# Patient Record
Sex: Female | Born: 1965 | Race: White | Hispanic: No | Marital: Married | Smoking: Never smoker
Health system: Southern US, Community
[De-identification: ages and names within clinical notes are randomized; demographics above are authoritative.]

## PROBLEM LIST (undated history)

## (undated) DIAGNOSIS — D034 Melanoma in situ of scalp and neck: Secondary | ICD-10-CM

## (undated) DIAGNOSIS — G709 Myoneural disorder, unspecified: Secondary | ICD-10-CM

## (undated) DIAGNOSIS — R1900 Intra-abdominal and pelvic swelling, mass and lump, unspecified site: Secondary | ICD-10-CM

## (undated) DIAGNOSIS — R519 Headache, unspecified: Secondary | ICD-10-CM

## (undated) DIAGNOSIS — J329 Chronic sinusitis, unspecified: Secondary | ICD-10-CM

## (undated) DIAGNOSIS — M7918 Myalgia, other site: Secondary | ICD-10-CM

## (undated) DIAGNOSIS — N644 Mastodynia: Secondary | ICD-10-CM

## (undated) DIAGNOSIS — R635 Abnormal weight gain: Secondary | ICD-10-CM

## (undated) DIAGNOSIS — K760 Fatty (change of) liver, not elsewhere classified: Secondary | ICD-10-CM

## (undated) DIAGNOSIS — R5383 Other fatigue: Secondary | ICD-10-CM

## (undated) DIAGNOSIS — R002 Palpitations: Secondary | ICD-10-CM

## (undated) DIAGNOSIS — R531 Weakness: Secondary | ICD-10-CM

## (undated) DIAGNOSIS — H539 Unspecified visual disturbance: Secondary | ICD-10-CM

## (undated) DIAGNOSIS — L989 Disorder of the skin and subcutaneous tissue, unspecified: Secondary | ICD-10-CM

## (undated) DIAGNOSIS — L988 Other specified disorders of the skin and subcutaneous tissue: Secondary | ICD-10-CM

## (undated) DIAGNOSIS — K5900 Constipation, unspecified: Secondary | ICD-10-CM

## (undated) DIAGNOSIS — E785 Hyperlipidemia, unspecified: Secondary | ICD-10-CM

## (undated) DIAGNOSIS — N898 Other specified noninflammatory disorders of vagina: Secondary | ICD-10-CM

## (undated) DIAGNOSIS — R51 Headache: Secondary | ICD-10-CM

## (undated) DIAGNOSIS — Z8619 Personal history of other infectious and parasitic diseases: Secondary | ICD-10-CM

## (undated) DIAGNOSIS — R42 Dizziness and giddiness: Secondary | ICD-10-CM

## (undated) DIAGNOSIS — R102 Pelvic and perineal pain: Secondary | ICD-10-CM

## (undated) DIAGNOSIS — N649 Disorder of breast, unspecified: Secondary | ICD-10-CM

## (undated) DIAGNOSIS — E079 Disorder of thyroid, unspecified: Secondary | ICD-10-CM

## (undated) DIAGNOSIS — C801 Malignant (primary) neoplasm, unspecified: Secondary | ICD-10-CM

## (undated) HISTORY — DX: Other specified disorders of the skin and subcutaneous tissue: L98.8

## (undated) HISTORY — PX: HERNIA REPAIR: SHX51

## (undated) HISTORY — PX: ABDOMINAL HYSTERECTOMY: SHX81

## (undated) HISTORY — DX: Myoneural disorder, unspecified: G70.9

## (undated) HISTORY — DX: Weakness: R53.1

## (undated) HISTORY — DX: Fatty (change of) liver, not elsewhere classified: K76.0

## (undated) HISTORY — DX: Hyperlipidemia, unspecified: E78.5

## (undated) HISTORY — DX: Headache: R51

## (undated) HISTORY — DX: Other specified noninflammatory disorders of vagina: N89.8

## (undated) HISTORY — DX: Malignant (primary) neoplasm, unspecified: C80.1

## (undated) HISTORY — DX: Dizziness and giddiness: R42

## (undated) HISTORY — DX: Disorder of breast, unspecified: N64.9

## (undated) HISTORY — DX: Unspecified visual disturbance: H53.9

## (undated) HISTORY — DX: Intra-abdominal and pelvic swelling, mass and lump, unspecified site: R19.00

## (undated) HISTORY — DX: Mastodynia: N64.4

## (undated) HISTORY — DX: Constipation, unspecified: K59.00

## (undated) HISTORY — DX: Pelvic and perineal pain: R10.2

## (undated) HISTORY — DX: Other fatigue: R53.83

## (undated) HISTORY — DX: Chronic sinusitis, unspecified: J32.9

## (undated) HISTORY — DX: Palpitations: R00.2

## (undated) HISTORY — DX: Headache, unspecified: R51.9

## (undated) HISTORY — DX: Abnormal weight gain: R63.5

## (undated) HISTORY — DX: Disorder of the skin and subcutaneous tissue, unspecified: L98.9

## (undated) HISTORY — DX: Personal history of other infectious and parasitic diseases: Z86.19

## (undated) HISTORY — DX: Myalgia, other site: M79.18

---

## 1998-01-21 ENCOUNTER — Other Ambulatory Visit: Admission: RE | Admit: 1998-01-21 | Discharge: 1998-01-21 | Payer: Self-pay | Admitting: Obstetrics and Gynecology

## 1998-02-05 ENCOUNTER — Ambulatory Visit (HOSPITAL_COMMUNITY): Admission: RE | Admit: 1998-02-05 | Discharge: 1998-02-05 | Payer: Self-pay | Admitting: Obstetrics and Gynecology

## 1998-09-29 ENCOUNTER — Other Ambulatory Visit: Admission: RE | Admit: 1998-09-29 | Discharge: 1998-09-29 | Payer: Self-pay | Admitting: Obstetrics and Gynecology

## 1998-10-05 ENCOUNTER — Inpatient Hospital Stay (HOSPITAL_COMMUNITY): Admission: AD | Admit: 1998-10-05 | Discharge: 1998-10-05 | Payer: Self-pay | Admitting: Obstetrics and Gynecology

## 1998-10-05 ENCOUNTER — Encounter: Payer: Self-pay | Admitting: Obstetrics and Gynecology

## 1999-01-29 ENCOUNTER — Other Ambulatory Visit: Admission: RE | Admit: 1999-01-29 | Discharge: 1999-01-29 | Payer: Self-pay | Admitting: Obstetrics and Gynecology

## 1999-09-17 ENCOUNTER — Other Ambulatory Visit: Admission: RE | Admit: 1999-09-17 | Discharge: 1999-09-17 | Payer: Self-pay | Admitting: Obstetrics and Gynecology

## 1999-11-06 ENCOUNTER — Encounter: Admission: RE | Admit: 1999-11-06 | Discharge: 1999-11-06 | Payer: Self-pay | Admitting: Obstetrics and Gynecology

## 1999-11-06 ENCOUNTER — Encounter: Payer: Self-pay | Admitting: Obstetrics and Gynecology

## 2000-07-07 ENCOUNTER — Inpatient Hospital Stay (HOSPITAL_COMMUNITY): Admission: AD | Admit: 2000-07-07 | Discharge: 2000-07-10 | Payer: Self-pay | Admitting: Obstetrics and Gynecology

## 2000-09-20 ENCOUNTER — Other Ambulatory Visit: Admission: RE | Admit: 2000-09-20 | Discharge: 2000-09-20 | Payer: Self-pay | Admitting: Obstetrics and Gynecology

## 2001-04-21 ENCOUNTER — Encounter: Admission: RE | Admit: 2001-04-21 | Discharge: 2001-04-21 | Payer: Self-pay | Admitting: Obstetrics and Gynecology

## 2001-04-21 ENCOUNTER — Encounter: Payer: Self-pay | Admitting: Obstetrics and Gynecology

## 2001-06-01 ENCOUNTER — Encounter: Admission: RE | Admit: 2001-06-01 | Discharge: 2001-06-01 | Payer: Self-pay | Admitting: Obstetrics and Gynecology

## 2001-06-01 ENCOUNTER — Encounter: Payer: Self-pay | Admitting: Obstetrics and Gynecology

## 2001-10-09 ENCOUNTER — Other Ambulatory Visit: Admission: RE | Admit: 2001-10-09 | Discharge: 2001-10-09 | Payer: Self-pay | Admitting: Obstetrics and Gynecology

## 2002-04-23 ENCOUNTER — Ambulatory Visit (HOSPITAL_COMMUNITY): Admission: RE | Admit: 2002-04-23 | Discharge: 2002-04-23 | Payer: Self-pay | Admitting: Gastroenterology

## 2002-11-01 ENCOUNTER — Encounter: Payer: Self-pay | Admitting: Obstetrics and Gynecology

## 2002-11-01 ENCOUNTER — Encounter: Admission: RE | Admit: 2002-11-01 | Discharge: 2002-11-01 | Payer: Self-pay | Admitting: Obstetrics and Gynecology

## 2002-12-05 ENCOUNTER — Other Ambulatory Visit: Admission: RE | Admit: 2002-12-05 | Discharge: 2002-12-05 | Payer: Self-pay | Admitting: Obstetrics and Gynecology

## 2002-12-07 ENCOUNTER — Other Ambulatory Visit: Admission: RE | Admit: 2002-12-07 | Discharge: 2002-12-07 | Payer: Self-pay | Admitting: Obstetrics and Gynecology

## 2003-03-13 ENCOUNTER — Encounter: Admission: RE | Admit: 2003-03-13 | Discharge: 2003-03-13 | Payer: Self-pay | Admitting: Obstetrics and Gynecology

## 2003-05-23 ENCOUNTER — Inpatient Hospital Stay (HOSPITAL_COMMUNITY): Admission: AD | Admit: 2003-05-23 | Discharge: 2003-05-24 | Payer: Self-pay | Admitting: Obstetrics and Gynecology

## 2003-06-27 ENCOUNTER — Inpatient Hospital Stay (HOSPITAL_COMMUNITY): Admission: RE | Admit: 2003-06-27 | Discharge: 2003-06-30 | Payer: Self-pay | Admitting: Obstetrics and Gynecology

## 2003-08-22 ENCOUNTER — Other Ambulatory Visit: Admission: RE | Admit: 2003-08-22 | Discharge: 2003-08-22 | Payer: Self-pay | Admitting: Obstetrics and Gynecology

## 2003-08-30 ENCOUNTER — Encounter: Admission: RE | Admit: 2003-08-30 | Discharge: 2003-08-30 | Payer: Self-pay | Admitting: Family Medicine

## 2003-09-05 ENCOUNTER — Encounter: Admission: RE | Admit: 2003-09-05 | Discharge: 2003-09-05 | Payer: Self-pay | Admitting: Obstetrics and Gynecology

## 2004-04-20 ENCOUNTER — Encounter: Admission: RE | Admit: 2004-04-20 | Discharge: 2004-04-20 | Payer: Self-pay | Admitting: Obstetrics and Gynecology

## 2004-11-13 ENCOUNTER — Encounter: Admission: RE | Admit: 2004-11-13 | Discharge: 2004-11-13 | Payer: Self-pay | Admitting: Obstetrics and Gynecology

## 2004-11-25 ENCOUNTER — Other Ambulatory Visit: Admission: RE | Admit: 2004-11-25 | Discharge: 2004-11-25 | Payer: Self-pay | Admitting: Obstetrics and Gynecology

## 2005-10-15 ENCOUNTER — Encounter (INDEPENDENT_AMBULATORY_CARE_PROVIDER_SITE_OTHER): Payer: Self-pay | Admitting: Specialist

## 2005-10-15 ENCOUNTER — Ambulatory Visit (HOSPITAL_COMMUNITY): Admission: RE | Admit: 2005-10-15 | Discharge: 2005-10-15 | Payer: Self-pay | Admitting: Obstetrics and Gynecology

## 2006-01-13 ENCOUNTER — Encounter: Admission: RE | Admit: 2006-01-13 | Discharge: 2006-01-13 | Payer: Self-pay | Admitting: Obstetrics and Gynecology

## 2006-11-03 ENCOUNTER — Encounter: Admission: RE | Admit: 2006-11-03 | Discharge: 2006-11-03 | Payer: Self-pay | Admitting: Obstetrics and Gynecology

## 2007-09-26 ENCOUNTER — Ambulatory Visit (HOSPITAL_COMMUNITY): Admission: RE | Admit: 2007-09-26 | Discharge: 2007-09-28 | Payer: Self-pay | Admitting: Obstetrics and Gynecology

## 2007-09-26 ENCOUNTER — Encounter (INDEPENDENT_AMBULATORY_CARE_PROVIDER_SITE_OTHER): Payer: Self-pay | Admitting: Obstetrics and Gynecology

## 2010-02-22 ENCOUNTER — Encounter: Payer: Self-pay | Admitting: Obstetrics and Gynecology

## 2010-06-16 NOTE — Op Note (Signed)
NAMEILANNA, DEIHL                ACCOUNT NO.:  [id number]   MEDICAL RECORD NO.:  [id number]          PATIENT TYPE:  OIB   LOCATION:  9316                          FACILITY:  [facility]   PHYSICIAN:  Guy Sandifer. Henderson Cloud, M.D. DATE OF BIRTH:  [birth year]   DATE OF PROCEDURE:  09/26/2007  DATE OF DISCHARGE:                               OPERATIVE REPORT   PREOPERATIVE DIAGNOSES:  Pelvic relaxation, menorrhagia, stress urinary  continence.   POSTOPERATIVE DIAGNOSES:  Pelvic relaxation, menorrhagia, stress urinary  continence.   PROCEDURES:  Laparoscopically-assisted vaginal hysterectomy, anterior  vaginal colporrhaphy, vaginal colpopexy, insertion of mesh, posterior  colporrhaphy, and cystoscopy.   SURGEON:  Guy Sandifer. Henderson Cloud, MD   ASSISTANT:  Zelphia Cairo, MD   ANESTHESIA:  General endotracheal intubation.   SPECIMENS:  Uterus to pathology.   ESTIMATED BLOOD LOSS:  300 mL.   INDICATIONS AND CONSENT:  The patient is a 45 year old married white  female G3, P2 with a long history of prolonged heavy menses.  She also  has complaints of pelvic relaxation and stress urinary continence.  Details of dictated in history and physical.  LAVH anterior-posterior  repair with mesh and transobturator mid urethral sling has been  discussed with the patient preoperatively.  Potential risks and  complications have been discussed preoperatively including, but limited  to infection, organ damage, bleeding requiring transfusion of blood  products with HIV and hepatitis acquisition, DVT, PE, pneumonia, fistula  formation, delayed healing, erosion, dyspareunia, postoperative pain,  returned to the operating room prolonged catheterization, self-  catheterization, and recurrent relaxation and incontinence have all been  reviewed.  All questions have been answered and consent is signed on the  chart.   FINDINGS:  Upper abdomen is grossly normal.  Uterus is 6 weeks in size.  Anterior-posterior cul-de-sacs  were normal and the ovaries normal.   PROCEDURE:  The patient was taken to operating room where she was  identified, placed in dorsal supine position, and general anesthesia is  induced via endotracheal intubation.  She is then placed in dorsal  lithotomy position where she is prepped abdominally and vaginally.  Bladder straight catheterized.  Hulka tenaculum was placed in the uterus  as a manipulator and she has draped in the sterile fashion.  The  infraumbilical and suprapubic areas are injected in the midline with  0.5% plain Marcaine.  A small infraumbilical incision was made and a  disposable Veress needle was placed on the first to attempt without  difficulty.  A normal syringe and drop tests were noted.  2 liters of  gas were insufflated under low-pressure with good tympany in the right  upper quadrant.  Veress needle was then removed.  A 10-11 Xcel bladeless  disposable trocar sleeve was then placed using direct visualization with  a diagnostic laparoscopic.  After placement, the operative laparoscope  was placed.  A small suprapubic incision was made in the midline and a 5-  mm Xcel bladeless disposable trocar sleeve was placed under direct  visualization without difficulty.  After noting the above findings, the  proximal ligaments were taken down the level  of vesicouterine peritoneum  bilaterally with a gyrus bipolar cautery cutting instrument.  Good  hemostasis was maintained.  The anterior vesicouterine peritoneum was  taken down sharply as well.  Suprapubic trocar sleeve was removed.  Instruments are removed and attention is turned to vagina.  Posterior  cul-de-sac is entered sharply.  Cervix is circumscribed as unipolar  cautery.  Mucosa is advanced sharply and bluntly.  Then using the  handheld bipolar cautery instrument, the uterosacral ligaments are taken  down followed by the bladder pillars, cardinal ligaments, and uterine  vessels bilaterally.  Fundus was delivered  posteriorly and the proximal  pedicles were taken down and the specimens delivered.  Uterosacral  ligaments then plicated vaginal cuff bilaterally using 0 Monocryl  suture.  They are then plicated midline with a third suture.  Cuff was  closed with figure-of-eights.  Next, the attention was turned to the  anterior colporrhaphy.  The anterior vaginal mucosa is injected widely  with 0.5% lidocaine with 1:200,000 epinephrine.  A midline incision is  made in the vaginal mucosa in the center of the vagina.  Sharp and blunt  dissection were used to take this down bilaterally and up to the point  of the vaginal cuff.  Blunt dissection is carried out to the ischial  spines bilaterally and the sacrospinous ligaments were palpated  bilaterally.  The 0 Monocryl anchoring sutures are then placed at the  point on the posterior sides of the vaginal mucosa superiorly where that  we used to anchor the pole graft later.  These were then held.  Then  using the Capio needle driver, the arms of the uphold graft are placed  through the sacrospinous ligaments bilaterally at least 1 fingerbreadth  medial to the ischial spines.  These were then pulled through to take  the pull graft closer to its final position.  The anchoring sutures are  used on either side of the midline to hold the superior edge of the  graft in place.  They are tied down.  Then proper tensioning is placed  on the arms bilaterally.  Examination is done to assure there is no  excessive tension on the arms.  The sheath on the arms is cut  bilaterally and the sheaths and the anchoring suture are removed from  the arms bilaterally intact.  The uphold graft is lying flat.  The  anterior vaginal mucosa is closed in a running locking fashion with 2-0  Monocryl suture.  Next, the points of incision for the obturator  incision is marked bilaterally.  This area is injected with 0.5%  lidocaine with 1:200,000 epinephrine.  The suburethral portion of  the  anterior vaginal mucosa was also injected in similar fashion.  The Foley  catheter was placed in the bladder which drains it for clear urine.  The  catheter was left in place.  Skin incisions were made bilaterally.  The  vaginal mucosa was then incised in the midline below the course of the  urethra.  This dissection was carried out bilaterally sharply and  bluntly to the level of the urogenital diaphragm.  Then, the obturator  polypropylene mesh, mid urethral sling is then placed using the halo  needles.  The halo needles were passed bilaterally through the obturator  foramen with the passage of the needle tip controlled with examining  finger bilaterally.  They are exited through the suburethral incision.  Foley catheter was then removed and cystoscopy was carried out with a 70  degree cystoscope.  360 degree inspection reveals no evidence of trauma  to the bladder.  There is no foreign bodies.  A good puff of urine is  noted from the ureteral orifices bilaterally.  The cystoscope was  removed, Foley catheter was replaced, the bladder was drained, and the  Foley catheter is left in place.  The polypropylene mesh sling is then  placed on the needle tips and the needles are then withdrawn back to the  skin incisions bilaterally.  The sheath was removed from the sling.  The  sling is noted to be lying flat with no kinking.  There is approximately  2-3 mm of space between the urethra and the graft.  A Kelly clamp placed  below the graft can easily be rotated perpendicular to the floor with no  tension on the graft as well.  The vaginal mucosa is closed in a running  locking fashion with 2-0 Monocryl suture.  The arms of the sling are  trimmed at the level of the skin bilaterally.  These incisions were then  closed with Dermabond.  A posterior vaginal repair is then undertaken.  A diamond-shaped wedge of tissue was removed from the perineal body.  The posterior vaginal mucosa was then  injected with 1.5% lidocaine and  1:200,000 epinephrine.  Posterior vaginal mucosa was then dissected from  the underlying rectum in the midline.  This dissection was carried out  bilaterally bluntly.  Rectovaginal mucosa was then reapproximated in the  midline with interrupted 0 Monocryl suture.  Rectal examination was then  undertaken and there is no evidence of trauma or excessive tension to  the rectum.  Posterior vaginal mucosa was then closed in a running  locking fashion with 2-0 Monocryl suture down to the level of the  perineal body.  Perineal body was then dissected and reapproximated with  2 figure-of-eight 0 Monocryl sutures.  A 2-0 Monocryl suture was then  continued on down and the perineal body was closed in the standard  episiotomy-type fashion.  A 1-inch vaginal packing with estrogen cream  was then placed in the vagina.  Attention was turned to the abdomen.  Pneumoperitoneum was reintroduced and the suprapubic trocar sleeve is  reintroduced under direct visualization.  Copious irrigation was carried  out.  Minor bleeding at peritoneal edges is controlled with bipolar  cautery.  Reinspection under reduced pneumoperitoneum reveals good  hemostasis.  Excess fluid is removed.  Trocar sleeves are removed.  Skin  incisions were closed with interrupted 2-0 Vicryl suture.  Dermabond is  placed on all the incisions as well.  All counts correct.  The patient  is awakened, taken to recovery room in stable condition.      Guy Sandifer Henderson Cloud, M.D.  Electronically Signed     JET/MEDQ  D:  09/26/2007  T:  09/27/2007  Job:  [id number]

## 2010-06-16 NOTE — H&P (Signed)
Margaret Andrews, Margaret Andrews                ACCOUNT NO.:  [id number]   MEDICAL RECORD NO.:  [id number]          PATIENT TYPE:  AMB   LOCATION:  SDC                           FACILITY:  [facility]   PHYSICIAN:  Guy Sandifer. Henderson Cloud, M.D. DATE OF BIRTH:  February 27, 1965   DATE OF ADMISSION:  DATE OF DISCHARGE:                              HISTORY & PHYSICAL   CHIEF COMPLAINT:  Heavy menses and pelvic relaxation.   HISTORY OF PRESENT ILLNESS:  This patient is a 45 year old married white  female G3 P2, who has a long history of prolonged sometimes heavy  menses.  She also has increasing rectal pressure.  Colonoscopy was  negative.  On examination, she has pelvic relaxation.  She also has  complaints of leaking urine with coughing and sneezing.  Urodynamic  studies were consistent with stress urinary incontinence.  After  discussion of options, she is being admitted for laparoscopically-  assisted vaginal hysterectomy, anterior and posterior vaginal repair  with grafts and a midurethral transobturator sling.  Potential risks and  complications have been reviewed preoperatively.   PAST MEDICAL HISTORY:  1. Hypothyroidism.  2. Negative workup for MS and for autoimmune antibodies.  3. Esophageal reflux.   PAST SURGICAL HISTORY:  1. Hernia repair.  2. Laparoscopy.  3. Hysteroscopy   OBSTETRICAL HISTORY:  Cesarean section x2.   FAMILY HISTORY:  Positive for heart disease, asthma, hepatitis,  osteoporosis, arthritis, skin disease, chronic hypertension, breast and  uterine cancer.   MEDICATIONS:  Synthroid 112 mcg daily   ALLERGIES:  AMOXICILLIN leading to rash.  CODEINE leading to rash.  SULFA MEDICATION leading to flu symptoms.   SOCIAL HISTORY:  Denies tobacco, alcohol or drug abuse.   REVIEW OF SYSTEMS:  NEURO:  Has history of migraine headache.  GI:  Has  history of irritable bowel syndrome.  CARDIAC:  Denies chest pain.   PHYSICAL EXAMINATION:  VITAL SIGNS:  Height 5 feet 6 inches, weight 148  pounds, and blood pressure 108/78.  HEENT:  Without thyromegaly.  LUNGS: Clear to auscultation.  HEART:  Regular rate and rhythm.  BACK:  Without CVA tenderness.  BREASTS:  Without mass, traction or discharge.  ABDOMEN:  Soft, nontender without masses.  PELVIC:  Vulvovaginal and cervix without lesion.  Uterus, easily descend  to the vaginal introitus.  There is a first and second-degree prolapse  of the anterior compartment.  Adnexa nontender without masses. On rectal  exam, there is adequate rectal sphincter tone.  There is rectocele that  is readily apparent.  EXTREMITIES:  Grossly within normal limits.  NEUROLOGICAL:  Grossly within normal limits.   ASSESSMENT:  1. Pelvic relaxation.  2. Menorrhagia.  3. Stress urinary incontinence.   PLAN:  Laparoscopically-assisted vaginal hysterectomy, anterior and  posterior repair with grafts and a transobturator midurethral sling.      Guy Sandifer Henderson Cloud, M.D.  Electronically Signed     JET/MEDQ  D:  09/19/2007  T:  09/20/2007  Job:  [id number]

## 2010-06-19 NOTE — Op Note (Signed)
   Margaret Andrews, Margaret Andrews                            ACCOUNT NO.:  [id number]   MEDICAL RECORD NO.:  [id number]                   PATIENT TYPE:  AMB   LOCATION:  ENDO                                 FACILITY:   PHYSICIAN:  Anselmo Rod, M.D.               DATE OF BIRTH:  [birth year]   DATE OF PROCEDURE:  04/23/2002  DATE OF DISCHARGE:                                 OPERATIVE REPORT   PROCEDURE:  Colonoscopy.   ENDOSCOPIST:  Charna Elizabeth, M.D.   INSTRUMENT USED:  Olympus video colonoscope.   INDICATIONS FOR PROCEDURE:  Rectal bleeding in a 45 year old white female.  Rule out colonic polyps, masses, etc.   PREPROCEDURE PREPARATION:  Informed consent was procured from the patient.  The patient was fasted for eight hours prior to the procedure and prepped  with a bottle of MiraLax and Gator-aid the night prior to the procedure.   PREPROCEDURE PHYSICAL:  VITAL SIGNS:  The patient had stable vital signs.  NECK:  Supple.  CHEST:  Clear to auscultation.  CARDIAC:  S1 and S2 regular.  ABDOMEN:  Soft with normal bowel sounds.   DESCRIPTION OF PROCEDURE:  The patient was placed in the left lateral  decubitus position and sedated with 100 mg of Demerol and 10 mg of Versed  intravenously.  Once the patient was adequately sedated, maintained on low  flow oxygen and continuous cardiac monitoring, the Olympus video colonoscope  was advanced from the rectum to the cecum and terminal ileum without  difficulty.  The entire colonic mucosa appeared healthy with normal vascular  pattern.  No masses, polyps, erosions, ulcerations, or diverticulosis were  noted.  On withdrawal inflamed internal hemorrhoids were seen on  retroflexion and the rectum and terminal ileum appeared normal and without  lesions.   IMPRESSION:  Normal colonoscopy up to the terminal ileum except for inflamed  internal hemorrhoids seen on retroflexion.   RECOMMENDATIONS:  1. Anusol suppositories 2.5% 1 p.o. q.h.s.  2.  High fiber diet with liberal fluid intake.  3. Repeat colorectal cancer screening at the age of 40 unless the patient     develops any abnormal symptoms in the interim.                                                 Anselmo Rod, M.D.    JNM/MEDQ  D:  04/23/2002  T:  04/23/2002  Job:  [id number]

## 2010-06-19 NOTE — Discharge Summary (Signed)
NAME:  Margaret Andrews, Margaret Andrews                          ACCOUNT NO.:  [id number]   MEDICAL RECORD NO.:  [id number]                   PATIENT TYPE:  INP   LOCATION:  9130                                 FACILITY:  [facility]   PHYSICIAN:  Juluis Mire, M.D.                DATE OF BIRTH:  Jan 21, 1966   DATE OF ADMISSION:  06/27/2003  DATE OF DISCHARGE:  06/30/2003                                 DISCHARGE SUMMARY   ADMITTING DIAGNOSES:  1. Intrauterine pregnancy at term.  2. Previous cesarean section, desires repeat.   DISCHARGE DIAGNOSES:  1. Status post low transverse cesarean section.  2. Viable female infant.   PROCEDURE:  Repeat low transverse cesarean section.   REASON FOR ADMISSION:  Please see dictated H&P.   HOSPITAL COURSE:  The patient was a 45 year old white married female gravida  3 para 1 with a history of a previous cesarean section.  The patient had  been given information regarding a vaginal birth after cesarean; however,  she desired repeat.  On the morning of admission the patient was taken to  the operating room where spinal anesthesia was administered without  difficulty.  A low transverse incision was made with the delivery of a  viable female infant weighing  7 pounds 7 ounces with Apgars of 9 at one  minute and 9 at five minutes.  Umbilical cord pH was 7.26.  The patient  tolerated the procedure well and was taken to the recovery room in stable  condition.  On postoperative day #1 vital signs were stable.  Abdomen was  soft with good return of bowel function.  Fundus was firm and nontender.  Abdominal dressing was noted to be clean, dry, and intact.  Labs revealed  hemoglobin of 11.7.  On postoperative day #2 the patient was without  complaint.  Vital signs were stable.  Abdomen was soft, fundus was firm and  nontender.  Abdominal dressing had been removed revealing an incision that  was clean, dry, and intact.  The patient was ambulating well and tolerating  a regular  diet without complaints of nausea and vomiting.  On postoperative  day #3 the patient was without complaint.  Vital signs were stable.  Fundus  was firm and nontender.  Incision was clean, dry, and intact.  Staples were  removed and the patient was discharged home.   CONDITION ON DISCHARGE:  Good.   DIET:  Regular as tolerated.   ACTIVITY:  No heavy lifting, no driving x2 weeks, no vaginal entry.   FOLLOW-UP:  The patient is to follow up in the office in 1 week for an  incision check.  She is to call for temperature greater than 100 degrees,  persistent nausea and vomiting, heavy vaginal bleeding, and/or redness or  drainage from the incisional site.   DISCHARGE MEDICATIONS:  1. Tylox #30 one q.4-6h. p.r.n.  2. Prenatal vitamins one p.o. daily.  3. Colace one p.o. daily p.r.n.     Julio Sicks, N.P.                        Juluis Mire, M.D.    CC/MEDQ  D:  07/17/2003  T:  07/18/2003  Job:  [id number]

## 2010-06-19 NOTE — Op Note (Signed)
NAMEMERIBETH, VITUG                ACCOUNT NO.:  [id number]   MEDICAL RECORD NO.:  [id number]          PATIENT TYPE:  AMB   LOCATION:  SDC                           FACILITY:  [facility]   PHYSICIAN:  Guy Sandifer. Henderson Cloud, M.D. DATE OF BIRTH:  09-Feb-1965   DATE OF PROCEDURE:  10/15/2005  DATE OF DISCHARGE:                                 OPERATIVE REPORT   DATE OF SURGERY:  10/15/2005   PREOPERATIVE DIAGNOSES:  1. Pelvic pain.  2. Menorrhagia.   POSTOPERATIVE DIAGNOSES:  1. Pelvic pain.  2. Menorrhagia.   PROCEDURE:  Laparoscopy with biopsy of right fallopian tube, hysteroscopy,  dilatation curettage, 1% Xylocaine paracervical block.   SURGEON:  Guy Sandifer. Henderson Cloud, M.D.   ANESTHESIA:  General endotracheal intubation.   ESTIMATED BLOOD LOSS:  Drops.   SPECIMENS:  Biopsy of right fallopian tube and endometrial curettings; both  to pathology.  I/O's with sorbitol, distending media 40 mL deficit.   INDICATIONS AND CONSENT:  This patient is a 45 year old married white female  G3, P2 with recurrent right lower quadrant pain and heavy menses.  Details  were dictated in the history and physical.  Laparoscopy, hysteroscopy and  D&C is discussed preoperatively.  Potential risks and complications were  discussed preoperatively including but limited to infection,  bowel/bladder/ureteral damage, bleeding requiring transfusion of blood  products and possible transfusion reaction, HIV and hepatitis acquisition,  DVT, PE, pneumonia, laparotomy, recurrent pain or heavy bleeding.  All  questions were answered and consent signed on the chart.   FINDINGS:  Upper abdomen is grossly normal.  Appendix was normal.  Anterior  posterior cul-de-sacs were normal.  Ovaries are normal bilaterally.  Left  fallopian tubes normal.  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  The  uterine canal was without abnormal structure.  Both fallopian tube ostia  noted.   PROCEDURE:  The patient is taken to the  operating room where she is  identified, placed in the dorsal supine position under general anesthesia.  She is induced via endotracheal intubation.  She is then placed in the  dorsal lithotomy position where she is prepped abdominally and vaginally,  and bladder straight catheterized.  She is draped in a sterile fashion.  Bivalve speculum was placed in the vagina.  Anterior cervical lip is  injected with 1% plain Xylocaine and grasped with a single-tooth tenaculum.  Paracervical block is then placed at the 2-, 4-, 5-, 7-, 8-, and 10-o'clock  positions with approximately 20 mL total of 1% Xylocaine plain.  Cervix was  then gently progressively dilated with a 27 Pratt dilator.  Diagnostic  hysteroscope was placed in the endocervical canal and passed under direct  visualization using sorbitol distending media.  The above findings are  noted.  Hysteroscope is withdrawn and sharp curettage is carried out.  The  single-tooth tenaculum is then replaced with a Hulka tenaculum.  Other  instruments are removed and attention is turned to the abdomen.  The  infraumbilical and suprapubic areas are injected with 1/2% plain Marcaine.  A small infraumbilical incision is made.  A disposable Veress  needle is  placed with a normal syringe and drop test; 2 liters of gas are then  insufflated under low pressure with good tympany in the right upper  quadrant.  Veress needle is then removed.  A 10/11 XL bladeless disposable  trocar sleeve is then placed using direct visualization with the diagnostic  laparoscope.  After placement is replaced with the operative laparoscope.  A  small suprapubic incision is made and a 5-mm XL bladeless disposable trocar  sleeve is placed under direct visualization without difficulty.  The above  findings are noted.  The hydatid cyst on the right tube is sharply resected  and sent to pathology.  Good hemostasis is noted.  Suprapubic trocar sleeve  was removed and the peritoneum is  reduced and the umbilical trocar sleeve is  removed.  The umbilical incision is closed with 2-0 Vicryl suture and the  subcutaneous layer with care made to not pick up any underlying structures.  The skin is closed on both incisions with Dermabond.  Hulka tenaculum was  removed.  Good hemostasis is noted.  All counts correct.  The patient is  awakened and taken to recovery room in stable condition.      Guy Sandifer Henderson Cloud, M.D.  Electronically Signed     JET/MEDQ  D:  10/15/2005  T:  10/17/2005  Job:  [id number]

## 2010-06-19 NOTE — Op Note (Signed)
NAME:  Margaret Andrews, Margaret Andrews                          ACCOUNT NO.:  [id number]   MEDICAL RECORD NO.:  [id number]                   PATIENT TYPE:  INP   LOCATION:  9130                                 FACILITY:  [facility]   PHYSICIAN:  Guy Sandifer. Arleta Creek, M.D.           DATE OF BIRTH:  [birth year]   DATE OF PROCEDURE:  06/27/2003  DATE OF DISCHARGE:                                 OPERATIVE REPORT   PREOPERATIVE DIAGNOSES:  1. Intrauterine pregnancy at term.  2. Previous cesarean section, desires repeat.   POSTOPERATIVE DIAGNOSES:  1. Intrauterine pregnancy at term.  2. Previous cesarean section, desires repeat.   PROCEDURE:  Repeat low transverse cesarean section.   SURGEON:  Guy Sandifer. Henderson Cloud, M.D.   ANESTHESIA:  Spinal, Quillian Quince, M.D.   ESTIMATED BLOOD LOSS:  800 mL.   FINDINGS:  A viable female infant; Apgars of 9 and 9 at one and five  minutes, respectively; birth weight 7 pounds 7 ounces. Arterial cord pH  7.26.   INDICATIONS AND CONSENT:  This patient is a 45 year old married white  female, G3, P1, with a history of a previous cesarean section. She desires  repeat. The potential risks and complications have been discussed with the  patient preoperatively including, but not limited to, infection; bowel,  bladder, ureteral damage; bleeding requiring transfusion of blood products;  the possible transfusion reaction, HIV and hepatitis acquisition; DVT, PE,  and pneumonia. All questions have been answered and consent is signed on the  chart.   DESCRIPTION OF PROCEDURE:  The patient is taken to the operating room where  she is identified, spinal anesthetic is placed, and she is placed in the  dorsosupine position. She is given a 15-degree left lateral wedge. She is  prepped, Foley catheter is placed in the bladder as a drain, and she is  draped in a sterile fashion. After testing for adequate spinal anesthesia,  skin is entered through a previous Pfannenstiel scar and  dissection is  carried out in layers to the peritoneum. The peritoneum is incised and  extended superiorly and inferiorly. The vesicouterine peritoneum is taken  down cephalad bilaterally, the bladder flap is developed, and the bladder  blade is placed. The uterus is incised in a low transverse manner, and the  uterine cavity is entered bluntly with a hemostat. Clear fluid is noted. The  uterine incision is extended cephalad bilaterally with the fingers. Several  loops of cord protrude from the incision. The vertex is delivered without  difficulty. Oropharynx and nasopharynx are suctioned. The remainder of the  baby is delivered. Good cry and tone is noted. The cord is clamped and cut  and the baby is handed to the awaiting pediatric's team. Placenta is  manually delivered. Uterus is cleaned and normal in contour. The uterus is  closed in a running locking fashion with 0 Monocryl suture which achieves  good hemostasis. Anterior peritoneum is closed  in a running fashion with 0  Monocryl suture which is also used to close the pyramidalis muscle in the  midline. Anterior rectus fascia is closed in a running fashion with 0 PDS  suture, and the skin is closed with clips. All sponge, instrument and needle  counts are correct, and the patient is transferred to the recovery room in  stable condition.                                               Guy Sandifer Arleta Creek, M.D.    JET/MEDQ  D:  06/27/2003  T:  06/28/2003  Job:  [id number]

## 2010-06-19 NOTE — Op Note (Signed)
[facility]  Patient:    Margaret Andrews, Margaret Andrews                          MRN: [id number] Proc. Date: 07/07/00 Adm. Date:  [id number] Attending:  Frederich Balding                           Operative Report  PREOPERATIVE DIAGNOSES:       1. Intrauterine pregnancy at term with                                  spontaneous rupture of membranes.                               2. Questionable prodromal symptoms for genital                                  herpes.  POSTOPERATIVE DIAGNOSES:      1. Intrauterine pregnancy at term with                                  spontaneous rupture of membranes.                               2. Questionable prodromal symptoms for genital                                  herpes.  OPERATION:                    Low transverse cesarean section.  SURGEON:                      Juluis Mire, M.D.  ANESTHESIA:                   Spinal.  ESTIMATED BLOOD LOSS:         800 cc.  PACKS AND DRAINS:             None.  INTRAOPERATIVE BLOOD REPLACED: None.  COMPLICATIONS:                None.  INDICATIONS:                  Dictated in History & Physical.  DESCRIPTION OF PROCEDURE:     The patient was taken to the OR and placed in supine position with left lateral tilt.  After acceptable level of spinal anesthesia was obtained, the abdomen was prepped with Betadine and draped in sterile field.  A low transverse skin incision was made with a knife and carried through subcutaneous tissue.  The fascia was entered sharply, and the incision in the fascia was extended laterally.  The fascia was taken off the muscle superiorly and inferiorly.   Rectus muscles were separated in the midline.  The peritoneum was entered, and the incision in the peritoneum was extended both superiorly and inferiorly.   Low transverse bladder flap was developed.  Low transverse uterine incision was begun with  knife and extended laterally using manual traction.   The amniotic fluid was clear.  The infant was in the vertex presentation and was delivered with elevation of head and fundal pressure.  The infant was a viable female weighing 9 pounds 1 ounce. Apgars were 9/9, umbilical artery pH was 7.30.  The placenta was then delivered manually.  The uterus was wiped free of remaining membranes and placenta.  The uterus was closed with interlocking sutures of 0 chromic using a two layer closure technique.   Good hemostasis was noted.  Urine output was clear and adequate.   Ovaries and tubes were visualized and noted to be unremarkable.  Muscles were reapproximated with running suture of 3-0 Vicryl. Fascia was closed with running suture of 0 Panacryl.  Skin was closed with staples and Steri-Strips.  Sponge, instrument, and needle count was reported correct by circulating nurse x 2.  Foley catheter remained clear at time of closure.  The patient tolerated the procedure well and was returned to the recovery room in good condition. DD:  07/07/00 TD:  07/07/00 Job: [id number]

## 2010-06-19 NOTE — H&P (Signed)
Margaret Andrews, Margaret Andrews                ACCOUNT NO.:  [id number]   MEDICAL RECORD NO.:  [id number]          PATIENT TYPE:  AMB   LOCATION:                                FACILITY:  [facility]   PHYSICIAN:  Guy Sandifer. Henderson Cloud, M.D.      DATE OF BIRTH:   DATE OF ADMISSION:  10/15/2005  DATE OF DISCHARGE:                                HISTORY & PHYSICAL   CHIEF COMPLAINT:  Right lower quadrant pain.   HISTORY OF PRESENT ILLNESS:  This patient is a 45 year old married white  female, G3, P2, who has recurrent random right lower quadrant pain.  She has  some pain with intercourse.  Pelvic ultrasound revealed normal-appearing  ovaries.  There was some fluid in the endometrial canal that outlined a mass  possibly consistent with a polyp.  After discussion of options, she is being  admitted for laparoscopy, hysteroscopy, D&C.  Potential risks and  complications have been discussed with the patient preoperatively.   PAST MEDICAL HISTORY:  1. Hypothyroidism.  2. Body aches and pains with two extensive negative workups for MS,      possible fibromyalgia.  3. Hepatic adenoma.  4. Reflux.   PAST SURGICAL HISTORY:  1. Laparoscopy 2001.  2. Hiatal hernia repair, [id number].   OBSTETRIC HISTORY:  Cesarean section x2.   FAMILY HISTORY:  Heart disease in maternal grandmother, sisters and father.  Chronic hypertension, maternal grandmother.  Varicosities in mother.  Asthma  in maternal grandmother.  Diabetes in mother and maternal grandmother.  Hypothyroidism in mother, maternal grandfather.  Breast cancer maternal  grandmother, paternal grandmother.  Uterine cancer maternal grandmother.  Bipolar disorder in father.   MEDICATIONS:  Synthroid, multivitamin and calcium.   ALLERGIES:  AMOXICILLIN AND CODEINE.   REVIEW OF SYSTEMS:  NEURO:  Denies headache.  CARDIAC:  Denies chest pain.  PULMONARY:  Denies shortness of breath.  GI:  Denies recent changes in bowel  habits.   PHYSICAL EXAMINATION:  Height 5 feet  5-3/4 inches, weight 123 pounds, blood  pressure 108/72.  HEENT:  Without thyromegaly.  LUNGS:  Clear to auscultation.  HEART:  Regular rate and rhythm.  BACK:  Without CVA tenderness.  BREASTS:  Without mass, retraction or discharge.  ABDOMEN:  Soft, nontender, without masses.  PELVIC:  Vulva, vagina and cervix without lesion.  Uterus is anteverted,  mobile, nontender.  Adnexa nontender without masses.  EXTREMITIES:  Grossly within normal limits.  NEUROLOGIC:  Exam grossly within normal limits.   ASSESSMENT:  Pelvic pain.   PLAN:  Laparoscopy, hysteroscopy, D&C.      Guy Sandifer Henderson Cloud, M.D.  Electronically Signed     JET/MEDQ  D:  10/12/2005  T:  10/12/2005  Job:  [id number]

## 2010-06-19 NOTE — Discharge Summary (Signed)
[facility]  Patient:    Margaret Andrews, Margaret Andrews                          MRN: [id number] Adm. Date:  [id number] Disc. Date: [id number] Attending:  Frederich Balding Dictator:   Danie Chandler, R.N.                           Discharge Summary  ADMITTING DIAGNOSES:          1. Intrauterine pregnancy at term with                                  spontaneous rupture of membranes.                               2. Questionable prodromal symptoms for genital                                  herpes.  DISCHARGE DIAGNOSES:          1. Intrauterine pregnancy at term with                                  spontaneous rupture of membranes.                               2. Questionable prodromal symptoms for genital                                  herpes.  PROCEDURE:                    On July 07, 2000 primary low transverse cesarean section.  REASON FOR ADMISSION:         Please see dictated H&P.  HOSPITAL COURSE:              The patient was taken to the operating room and underwent the above named procedure without complications.  This was productive of a viable female infant with Apgars of 9 at one minute and 9 at five minutes and an arterial cord pH of 7.30.  Postoperatively on day #1 the patient had good control of pain and a good return of bowel function.  Her hemoglobin on this day was 10.9, hematocrit 31.4, white blood cell count 9.4. On postoperative day #2 the patient was without complaints.  She was ambulating well without difficulty and tolerating a regular diet.  She was discharged home on postoperative day #3.  CONDITION ON DISCHARGE:       Good.  DIET:                         Regular, as tolerated.  ACTIVITY:                     No heavy lifting, no driving, no vaginal entry.  FOLLOW-UP:                    In the office in  one to two weeks for incision check.  She is to call for temperature greater than 100 degrees, persistent nausea or vomiting,  heavy vaginal bleeding, and/or redness or drainage from the incision site.  DISCHARGE MEDICATIONS:        1. Prenatal vitamins one p.o. q.d.                               2. Tylox #30 as directed by M.D.                               3. Motrin 800 mg #30 as directed by M.D.DD: 07/26/00 TD:  07/26/00 Job: [id number]

## 2010-06-19 NOTE — H&P (Signed)
NAME:  Margaret Andrews, Margaret Andrews NO.:  [id number]   MEDICAL RECORD NO.:  [id number]                  PATIENT TYPE:   LOCATION:                                       FACILITY:   PHYSICIAN:  Guy Sandifer. Arleta Creek, M.D.           DATE OF BIRTH:   DATE OF ADMISSION:  06/27/2003  DATE OF DISCHARGE:                                HISTORY & PHYSICAL   CHIEF COMPLAINT:  Desires repeat cesarean.   HISTORY OF PRESENT ILLNESS:  This patient is a 45 year old married white  female, G3, P1, with an EDC of 07/04/2003 consistent with early ultrasound  who has a history of cesarean section.  After discussion of the options, she  is being admitted for repeat cesarean section.  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  Prenatal care was also complicated by advanced  maternal age.  Level II ultrasound screen at 11 weeks was within normal  limits.  Cystic fibrosis screening was negative.  Patient is hypothyroid on  replacement therapy.  Potential risks and complications have been reviewed  preoperatively.   PAST MEDICAL HISTORY:  See PRENATAL HISTORY AND PHYSICAL.   PAST SURGICAL HISTORY:  See PRENATAL HISTORY AND PHYSICAL.   FAMILY HISTORY:  See PRENATAL HISTORY AND PHYSICAL.   OBSTETRIC HISTORY:  See PRENATAL HISTORY AND PHYSICAL.   MEDICATIONS:  Prenatal vitamins.   ALLERGIES:  CODEINE, CLARITIN, AND AMOXICILLIN.   REVIEW OF SYSTEMS:  NEURO:  Denies headache.  CARDIAC:  Denies chest pain.  PULMONARY:  Denies cough.  GI:  Denies recent changes in bowel habits.   PHYSICAL EXAMINATION:  Height 5 feet 5 inches.  Weight 161 lb.  Blood  pressure 90/50.  LUNGS:  Clear to auscultation.  HEART:  Regular rate and rhythm.  BACK:  Without CVA tenderness.  BREASTS:  Not examined.  ABDOMEN:  Gravid, nontender.  EXTREMITIES:  Grossly within normal limits.  NEUROLOGIC:  Grossly within normal limits.   PRENATAL LABORATORY STUDIES:  Blood type O positive.  Rh antibody  screening  negative.  RPR non-reactive.  Rubella titer immune.  Hepatitis B surface  antigen negative.  HIV non-reactive.  Gonorrhea and Chlamydia negative.   ASSESSMENT:  Intrauterine pregnancy at 59 weeks estimated gestational age  for repeat cesarean section.   PLAN:  Repeat cesarean section.                                               Guy Sandifer Arleta Creek, M.D.    JET/MEDQ  D:  06/27/2003  T:  06/27/2003  Job:  [id number]

## 2011-03-29 ENCOUNTER — Other Ambulatory Visit: Payer: Self-pay | Admitting: Obstetrics and Gynecology

## 2011-03-29 DIAGNOSIS — N63 Unspecified lump in unspecified breast: Secondary | ICD-10-CM

## 2011-04-05 ENCOUNTER — Ambulatory Visit
Admission: RE | Admit: 2011-04-05 | Discharge: 2011-04-05 | Disposition: A | Discharge status: Home / Self Care | Payer: 59 | Source: Ambulatory Visit | Attending: Obstetrics and Gynecology | Admitting: Obstetrics and Gynecology

## 2011-04-05 DIAGNOSIS — N63 Unspecified lump in unspecified breast: Secondary | ICD-10-CM

## 2011-04-06 ENCOUNTER — Other Ambulatory Visit: Payer: Self-pay

## 2011-09-13 ENCOUNTER — Encounter: Payer: Self-pay | Admitting: *Deleted

## 2011-09-13 ENCOUNTER — Emergency Department (INDEPENDENT_AMBULATORY_CARE_PROVIDER_SITE_OTHER)
Admission: EM | Admit: 2011-09-13 | Discharge: 2011-09-13 | Disposition: A | Discharge status: Home / Self Care | Payer: 59 | Source: Home / Self Care

## 2011-09-13 DIAGNOSIS — W57XXXA Bitten or stung by nonvenomous insect and other nonvenomous arthropods, initial encounter: Secondary | ICD-10-CM

## 2011-09-13 DIAGNOSIS — S139XXA Sprain of joints and ligaments of unspecified parts of neck, initial encounter: Secondary | ICD-10-CM

## 2011-09-13 DIAGNOSIS — S161XXA Strain of muscle, fascia and tendon at neck level, initial encounter: Secondary | ICD-10-CM

## 2011-09-13 HISTORY — DX: Melanoma in situ of scalp and neck: D03.4

## 2011-09-13 HISTORY — DX: Disorder of thyroid, unspecified: E07.9

## 2011-09-13 MED ORDER — METHYLPREDNISOLONE SODIUM SUCC 125 MG IJ SOLR
125.0000 mg | Freq: Once | INTRAMUSCULAR | Status: AC
  Administered 2011-09-13: 125 mg via INTRAMUSCULAR

## 2011-09-13 NOTE — ED Notes (Signed)
Patient was bitten today @ 11am on her RUE. She has taken one benadryl.Mild redness and swelling present. At the time of the bite pain went up into her neck, this has resolved.  She also reports swelling on the back of her neck x 1 week. She believes she may have been bitten.

## 2011-09-13 NOTE — ED Provider Notes (Signed)
History     CSN: [id number]  Arrival date & time 09/13/11  1520   First MD Initiated Contact with Patient 09/13/11 1524      Chief Complaint  Patient presents with  . Insect Bite  . Edema    neck    HPI Comments: Pt lifts heavy objects on a regular basis at work at [employer] Neck pain is mainly R sided.  Neck pain usually occurs at the end of the day.  Pt states that she has had a relatively extensive workup in the past for muscle pain including MRI and nerve conduction studies that were negative.  Pt also reports completing course of prednisone and antibiotics for sinusitis 1-2 weeks ago. This was rxd by PCP per pt.     Patient is a 46 y.o. female presenting with neck injury.  Neck Injury This is a new problem. The current episode started more than 1 week ago. The problem occurs daily. Progression since onset: waxing and waning  Associated symptoms comments: No radicular sxs, numbness or paresthesia. . The symptoms are aggravated by twisting (lifitng heavy objects, prolonged standing. Pt lifts heavy objects on a regular basis ). Relieved by: rest.  She has tried rest for the symptoms. The treatment provided mild relief.   Insect bite: Pt reports being stung by insect earlier today. 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  Unsure of insect. Believes it may have been a bee.  Has had worsening swelling since incident. Minimal erythema. No chest tightness, SOB. No CP.  Has used benadryl with minimal improvement in itching. No prior hx/o allergic reactions to insect/bee stings in the past.     Past Medical History  Diagnosis Date  . Thyroid disease   . Melanoma in situ of scalp     Past Surgical History  Procedure Date  . Cesarean section   . Abdominal hysterectomy     partial  . Hernia repair     Family History  Problem Relation Age of Onset  . Diabetes Mother   . Heart disease Father     History  Substance Use Topics  . Smoking status: Never Smoker   .  Smokeless tobacco: Not on file  . Alcohol Use: Yes    OB History    Grav Para Term Preterm Abortions TAB SAB Ect Mult Living                  Review of Systems  All other systems reviewed and are negative.    Allergies  Amoxicillin; Avelox; Codeine; and Sulfa antibiotics  Home Medications   Current Outpatient Rx  Name Route Sig Dispense Refill  . ESOMEPRAZOLE MAGNESIUM 40 MG PO CPDR Oral Take 40 mg by mouth daily before breakfast.    . FINASTERIDE 1 MG PO TABS Oral Take 1 mg by mouth daily.    Marland Kitchen LEVOTHYROXINE SODIUM 25 MCG PO TABS Oral Take 25 mcg by mouth daily.    Marland Kitchen SPIRONOLACTONE 25 MG PO TABS Oral Take 25 mg by mouth daily.      BP 123/85  Pulse 105  Temp 98.8 F (37.1 C) (Oral)  Resp 14  Ht 5\' 5"  (1.651 m)  Wt 147 lb (66.679 kg)  BMI 24.46 kg/m2  SpO2 97%  Physical Exam  Constitutional: She appears well-developed and well-nourished.  HENT:  Head: Normocephalic and atraumatic.  Mouth/Throat: Oropharynx is clear and moist.  Eyes: Conjunctivae are normal. Pupils are equal, round, and reactive to light.  Neck: Normal range  of motion.    Skin:          Noted 2x3 cm area of circumferential edema on RUE. No TTP. No purulence.     ED Course  Procedures (including critical care time)  Labs Reviewed - No data to display No results found.   1. Insect bite   2. Cervical strain       MDM  Will treat with solumedrol 125mg  IM x 1.  No signs of systemic reaction from bug bite.  Continue benadryl and other antihistamines. Discussed infectious red flags.    Neck exam consistent with cervical strain.  Discussed RICE and NSAID treatment.  No signs of radiculopathy or systemic sxs.  Higher risk for strain given occupation.  Handout given.      The patient and/or caregiver has been counseled thoroughly with regard to treatment plan and/or medications prescribed including dosage, schedule, interactions, rationale for use, and possible side effects and  they verbalize understanding. Diagnoses and expected course of recovery discussed and will return if not improved as expected or if the condition worsens. Patient and/or caregiver verbalized understanding.             Floydene Flock, MD 09/13/11 [phone]

## 2011-09-16 NOTE — ED Provider Notes (Signed)
Agree with exam, assessment, and plan.   Lattie Haw, MD 09/16/11 [id number]

## 2011-09-23 ENCOUNTER — Other Ambulatory Visit: Payer: Self-pay | Admitting: Obstetrics and Gynecology

## 2011-09-23 DIAGNOSIS — M7989 Other specified soft tissue disorders: Secondary | ICD-10-CM

## 2011-09-28 ENCOUNTER — Ambulatory Visit
Admission: RE | Admit: 2011-09-28 | Discharge: 2011-09-28 | Disposition: A | Discharge status: Home / Self Care | Payer: 59 | Source: Ambulatory Visit | Attending: Obstetrics and Gynecology | Admitting: Obstetrics and Gynecology

## 2011-09-28 DIAGNOSIS — M7989 Other specified soft tissue disorders: Secondary | ICD-10-CM

## 2011-10-25 ENCOUNTER — Encounter (INDEPENDENT_AMBULATORY_CARE_PROVIDER_SITE_OTHER): Payer: Self-pay | Admitting: General Surgery

## 2011-10-26 ENCOUNTER — Ambulatory Visit (INDEPENDENT_AMBULATORY_CARE_PROVIDER_SITE_OTHER): Payer: 59 | Admitting: General Surgery

## 2011-11-30 ENCOUNTER — Encounter (INDEPENDENT_AMBULATORY_CARE_PROVIDER_SITE_OTHER): Payer: Self-pay | Admitting: General Surgery

## 2011-11-30 ENCOUNTER — Ambulatory Visit (INDEPENDENT_AMBULATORY_CARE_PROVIDER_SITE_OTHER): Payer: 59 | Admitting: General Surgery

## 2011-11-30 VITALS — BP 124/72 | HR 72 | Temp 97.7°F | Resp 16 | Ht 65.5 in | Wt 149.6 lb

## 2011-11-30 DIAGNOSIS — R2230 Localized swelling, mass and lump, unspecified upper limb: Secondary | ICD-10-CM

## 2011-11-30 DIAGNOSIS — R229 Localized swelling, mass and lump, unspecified: Secondary | ICD-10-CM

## 2011-11-30 NOTE — Progress Notes (Signed)
Patient ID: Margaret Andrews, female   DOB: 05/06/1965, 46 y.o.   MRN: [id number]  Chief Complaint  Patient presents with  . Pre-op Exam    eval bil axilary lypmphnodes    HPI Margaret Andrews is a 46 y.o. female.  She was sent to me by Dr. Hulan Fess for evaluation of fullness in her axilla.  This patient is generally healthy, 9 years old, 2 children, 2 C-sections. She says that she's had a 26 pound weight gain over the past 12 months. She's noticed swelling under her axilla for 6 months but there  has been no pain or infection. Doesn't really feel a mass just  more tissue under there when she brings her arms down to her side.  Recent mammograms are category 1. Recent ultrasound shows no adenopathy only slightly prominent axillary fat pads bilaterally.  Family history reveals breast cancer in both grandmothers otherwise no significant family history.  Patient's personal history is notable for a LAVH for uterine prolapse, alopecia, and early scalp melanoma, hyperlipidemia, and GERD. 2 C-sections in the past. HPI  Past Medical History  Diagnosis Date  . Thyroid disease   . Melanoma in situ of scalp   . Abdominal swelling   . Pelvic pain in female     occasional  . Fatigue   . Weight gain   . Constipation   . Skin disease   . Dizziness   . Vaginal discharge   . Sinus infection   . Generalized headaches   . Change in vision   . Palpitations   . Breast pain   . Weakness     musculoskeletal  . Musculoskeletal pain   . Skin lesion of breast     left - 6 mm lightly pigmented, upper/inner quadrant  . History of chlamydia   . Cancer     skin  . Neuromuscular disorder   . Hyperlipidemia     Past Surgical History  Procedure Date  . Cesarean section 2002, 2005  . Abdominal hysterectomy 2010 - approximate    partial  . Hernia repair 1992 or 1993    Family History  Problem Relation Age of Onset  . Diabetes Mother   . Heart disease Father   . Cancer Maternal Grandmother    breast  . Cancer Paternal Grandmother     breast, uterine    Social History History  Substance Use Topics  . Smoking status: Never Smoker   . Smokeless tobacco: Never Used  . Alcohol Use: Yes     4 - 5 drinks during week    Allergies  Allergen Reactions  . Amoxicillin   . Avelox (Moxifloxacin Hcl In Nacl)   . Codeine   . Sulfa Antibiotics     Current Outpatient Prescriptions  Medication Sig Dispense Refill  . acyclovir ointment (ZOVIRAX) 5 %       . esomeprazole (NEXIUM) 40 MG capsule Take 40 mg by mouth daily before breakfast.      . finasteride (PROPECIA) 1 MG tablet Take 1 mg by mouth daily.      Marland Kitchen levothyroxine (SYNTHROID, LEVOTHROID) 25 MCG tablet Take 25 mcg by mouth daily.      Marland Kitchen spironolactone (ALDACTONE) 25 MG tablet Take 25 mg by mouth daily.        Review of Systems Review of Systems  Constitutional: Negative for fever, chills and unexpected weight change.  HENT: Negative for hearing loss, congestion, sore throat, trouble swallowing and voice change.   Eyes: Negative for  visual disturbance.  Respiratory: Negative for cough and wheezing.   Cardiovascular: Negative for chest pain, palpitations and leg swelling.  Gastrointestinal: Negative for nausea, vomiting, abdominal pain, diarrhea, constipation, blood in stool, abdominal distention and anal bleeding.  Genitourinary: Negative for hematuria, vaginal bleeding and difficulty urinating.  Musculoskeletal: Negative for arthralgias.  Skin: Negative for rash and wound.  Neurological: Negative for seizures, syncope and headaches.  Hematological: Negative for adenopathy. Does not bruise/bleed easily.  Psychiatric/Behavioral: Negative for confusion.    Blood pressure 124/72, pulse 72, temperature 97.7 F (36.5 C), temperature source Temporal, resp. rate 16, height 5' 5.5" (1.664 m), weight 149 lb 9.6 oz (67.858 kg).  Physical Exam Physical Exam  Constitutional: She is oriented to person, place, and time. She  appears well-developed and well-nourished. No distress.  HENT:  Mouth/Throat: No oropharyngeal exudate.  Eyes: Conjunctivae normal and EOM are normal. Pupils are equal, round, and reactive to light. Left eye exhibits no discharge. No scleral icterus.  Neck: Neck supple. No JVD present. No tracheal deviation present. No thyromegaly present.  Cardiovascular: Normal rate, regular rhythm, normal heart sounds and intact distal pulses.   No murmur heard. Pulmonary/Chest: Effort normal and breath sounds normal. No respiratory distress. She has no wheezes. She has no rales. She exhibits no tenderness.       Slightly prominent axillary fat pads bilaterally. No real mass. No redundant skin. No adenopathy. No tenderness. Breast exam not repeated, recently performed by Dr. Henderson Cloud and recent mammograms normal.  Musculoskeletal: She exhibits no edema and no tenderness.  Lymphadenopathy:    She has no cervical adenopathy.  Neurological: She is alert and oriented to person, place, and time. She exhibits normal muscle tone. Coordination normal.  Skin: Skin is warm. No rash noted. She is not diaphoretic. No erythema. No pallor.  Psychiatric: She has a normal mood and affect. Her behavior is normal. Judgment and thought content normal.    Data Reviewed Dr. Huel Coventry office notes and recent mammograms and axillary ultrasound.  Assessment    Prominent, benign axillary fat pads bilaterally. No evidence of adenopathy,  mass, or pathologic change.  This may be related to her recent weight gain    Plan    She was reassured that there did not appear to be a pathologic process, ended there is no indication for surgical excision  Return to see me when necessary       Angelia Mould. Derrell Lolling, M.D., [facility] General and Minimally invasive Surgery Breast and Colorectal Surgery Office:   [phone] Pager:   [phone]  11/30/2011, 1:50 PM

## 2011-11-30 NOTE — Patient Instructions (Signed)
The fullness in your underarm, or axillae, is normal fatty tissue. There are no abnormal lymph nodes and your breasts  are also normal. This may be related to your recent weight gain But is not a sign of any disease.  There is no indication for surgery, and I would advise against that because of the blood vessels and nerves under your arm.  Return to see Dr. Derrell Lolling if further problems arise.

## 2011-12-23 ENCOUNTER — Encounter (INDEPENDENT_AMBULATORY_CARE_PROVIDER_SITE_OTHER): Payer: 59 | Admitting: General Surgery

## 2012-07-27 ENCOUNTER — Other Ambulatory Visit: Payer: Self-pay | Admitting: Dermatology

## 2013-02-19 ENCOUNTER — Other Ambulatory Visit: Payer: Self-pay | Admitting: Obstetrics and Gynecology

## 2013-02-19 DIAGNOSIS — Z1231 Encounter for screening mammogram for malignant neoplasm of breast: Secondary | ICD-10-CM

## 2013-02-28 ENCOUNTER — Ambulatory Visit: Payer: 59

## 2013-03-05 ENCOUNTER — Ambulatory Visit: Payer: 59

## 2013-08-01 IMAGING — MG MM DIGITAL DIAGNOSTIC BILAT
4 series · 4 of 4 positions shown · non-contrast
Comparison: 04/05/2011, 11/03/2006.

CLINICAL DATA: Palpable swelling in both axillae.  Recent course
of oral corticosteroid therapy.

DIGITAL DIAGNOSTIC BILATERAL MAMMOGRAM WITH CAD AND BILATERAL
BREAST/AXILLA ULTRASOUND:

[R CC]
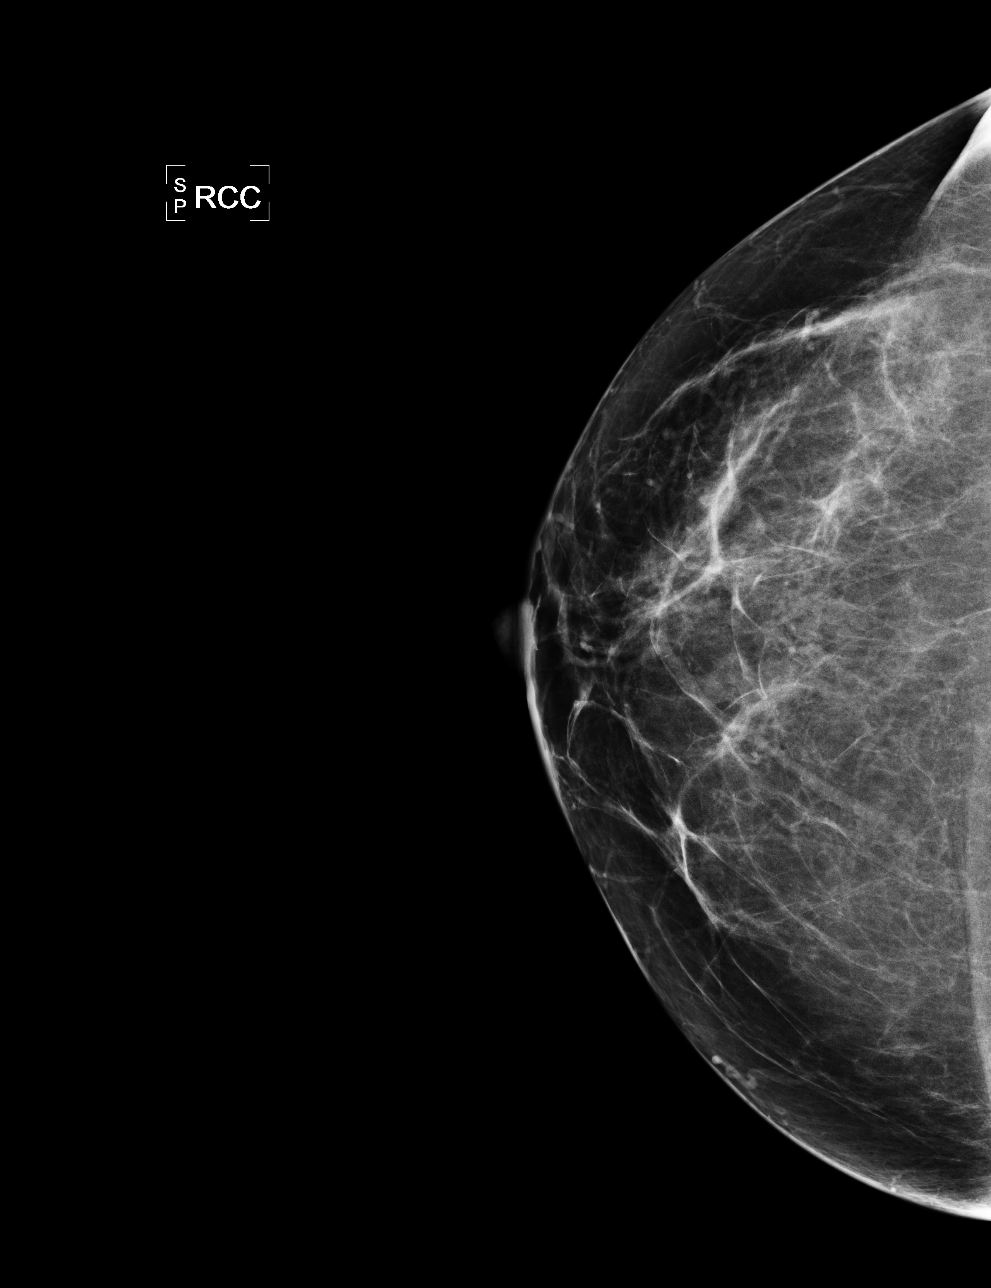

[L CC]
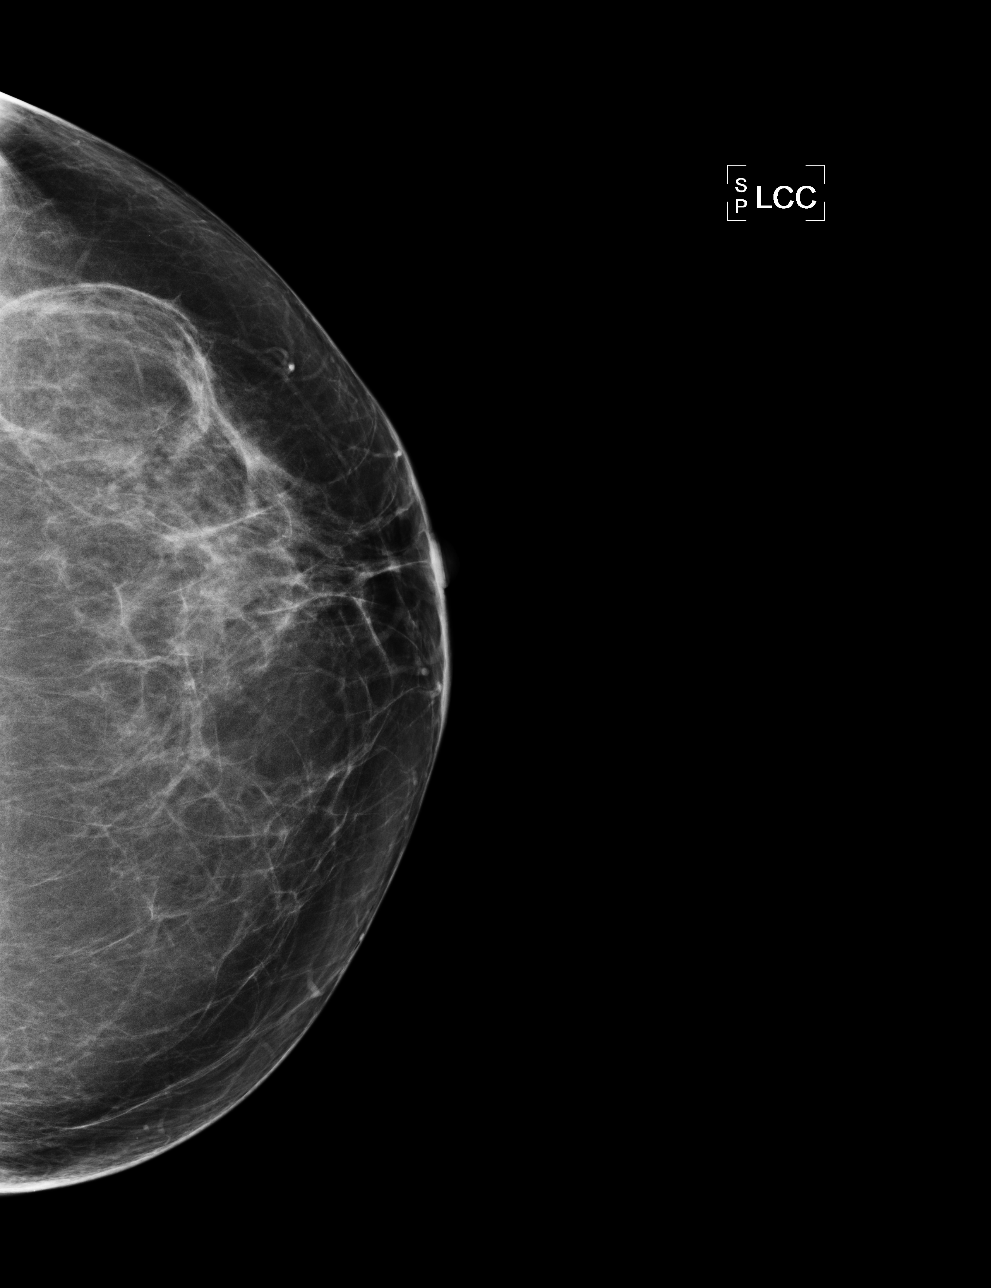

[L MLO]
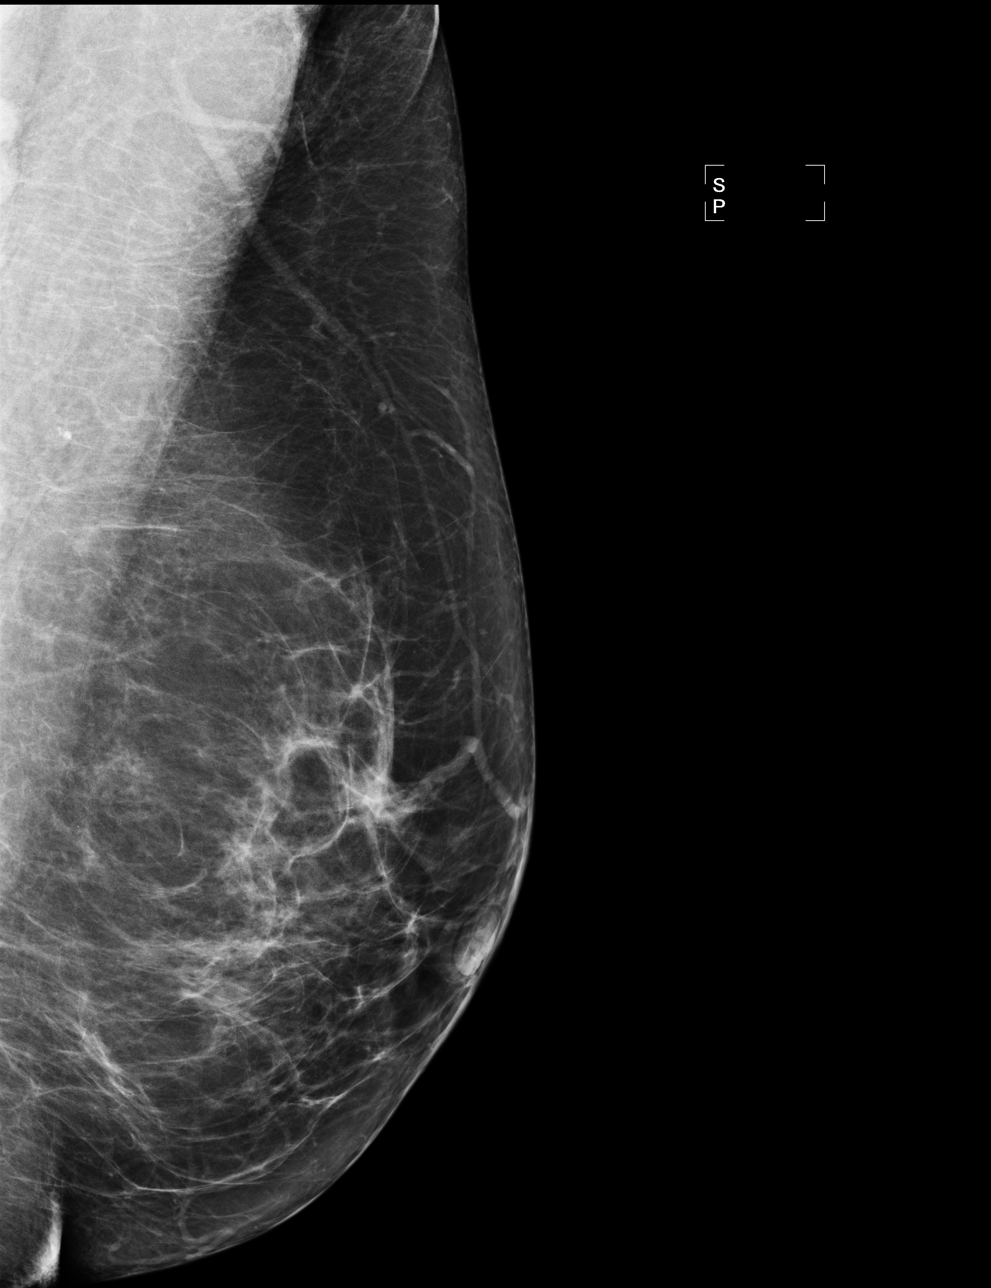

[R MLO]
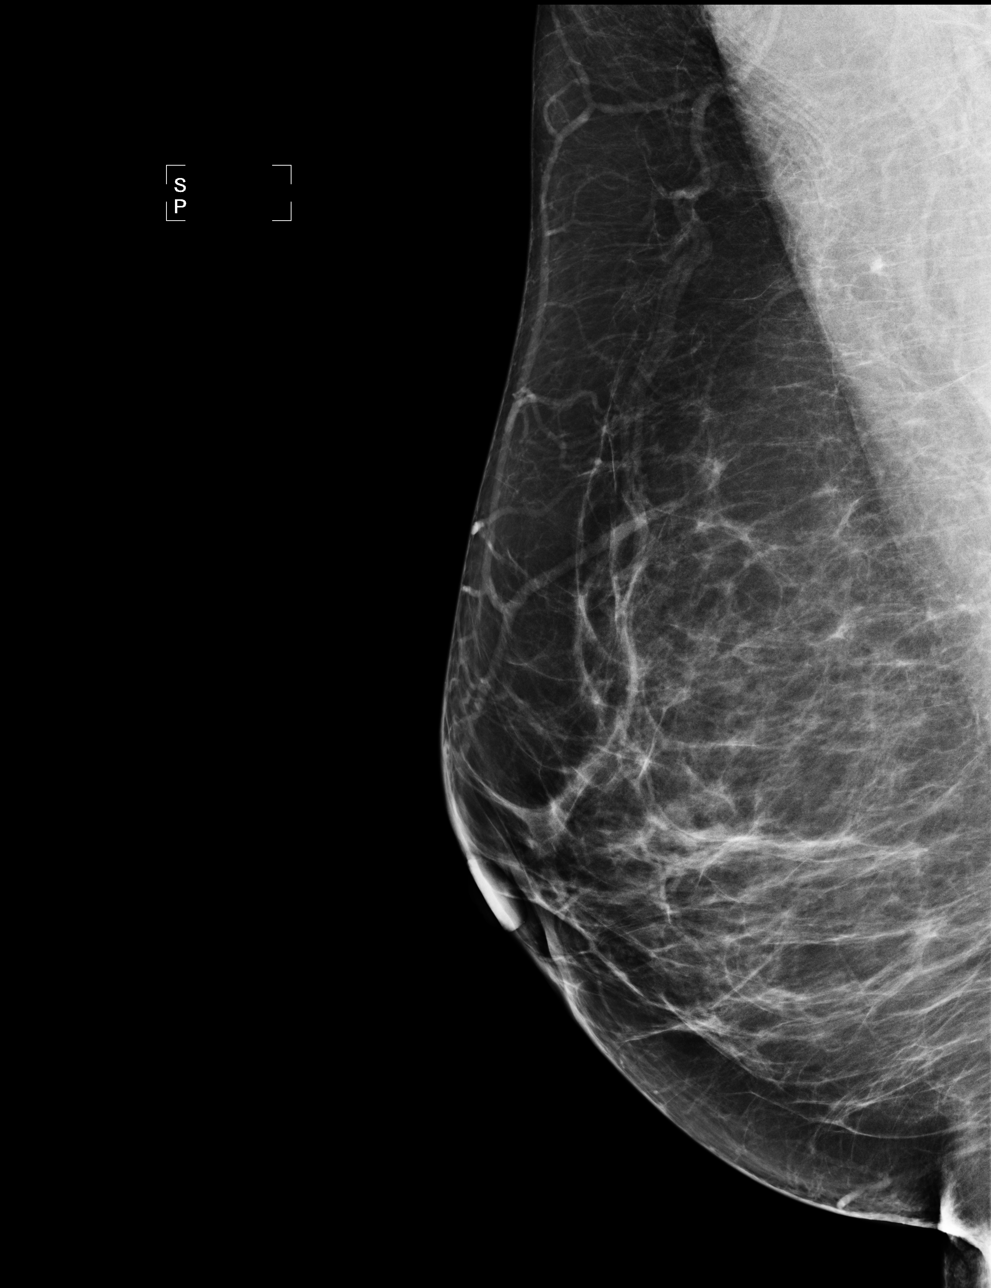

[4 of 4 positions shown; findings below may reference images not displayed]

FINDINGS: CC and MLO views of both breast were obtained.
Scattered fibroglandular breast tissue, unchanged.  No new
suspicious mass, nonsurgical architectural distortion, or
suspicious calcifications in either breast.
Mammographic images were processed with CAD.

On physical exam, I palpate no mass or lymphadenopathy in either
axilla.

Ultrasound is performed, showing prominent normal-appearing
axillary fat pads bilaterally.  No mass or pathologic
lymphadenopathy were identified.
IMPRESSION: 1.  Prominent axillary fat pads bilaterally correspond to the areas
of palpable swelling.  No mass or pathologic lymphadenopathy in
either axilla.
2.  No specific mammographic evidence of malignancy.

RECOMMENDATION:
Screening mammogram in one year. (Code:4X-1-8TS)

The patient was encouraged to perform monthly self breast
examination and communicate any changes with her primary physician.
Findings and recommendations were discussed with the patient and
provided in written form at the time of the exam.

BI-RADS CATEGORY 1:  Negative.

## 2013-10-05 ENCOUNTER — Ambulatory Visit: Payer: 59

## 2013-10-12 ENCOUNTER — Ambulatory Visit: Payer: 59

## 2013-10-25 ENCOUNTER — Ambulatory Visit
Admission: RE | Admit: 2013-10-25 | Discharge: 2013-10-25 | Disposition: A | Discharge status: Home / Self Care | Payer: 59 | Source: Ambulatory Visit | Attending: Obstetrics and Gynecology | Admitting: Obstetrics and Gynecology

## 2013-10-25 DIAGNOSIS — Z1231 Encounter for screening mammogram for malignant neoplasm of breast: Secondary | ICD-10-CM

## 2015-08-10 ENCOUNTER — Encounter: Payer: Self-pay | Admitting: Emergency Medicine

## 2015-08-10 ENCOUNTER — Emergency Department (INDEPENDENT_AMBULATORY_CARE_PROVIDER_SITE_OTHER)
Admission: EM | Admit: 2015-08-10 | Discharge: 2015-08-10 | Disposition: A | Discharge status: Home / Self Care | Payer: 59 | Source: Home / Self Care | Attending: Family Medicine | Admitting: Family Medicine

## 2015-08-10 DIAGNOSIS — J069 Acute upper respiratory infection, unspecified: Secondary | ICD-10-CM | POA: Diagnosis not present

## 2015-08-10 DIAGNOSIS — B9789 Other viral agents as the cause of diseases classified elsewhere: Principal | ICD-10-CM

## 2015-08-10 LAB — POCT INFLUENZA A/B
INFLUENZA B, POC: NEGATIVE
Influenza A, POC: NEGATIVE

## 2015-08-10 MED ORDER — DOXYCYCLINE HYCLATE 100 MG PO CAPS: 100.0000 mg | ORAL_CAPSULE | Freq: Two times a day (BID) | ORAL | Status: DC

## 2015-08-10 MED ORDER — BENZONATATE 200 MG PO CAPS: 200.0000 mg | ORAL_CAPSULE | Freq: Every day | ORAL | Status: DC

## 2015-08-10 NOTE — ED Provider Notes (Signed)
CSN: [id number]     Arrival date & time 08/10/15  1351 History   First MD Initiated Contact with Patient 08/10/15 1444     Chief Complaint  Patient presents with  . Fever      HPI Comments: At about 10am yesterday patient developed flu-like symptoms including myalgias, headache, chills, and fatigue.  Also has mild nasal congestion and sore throat. Today she developed a cough.  No pleuritic pain or shortness of breath.   The history is provided by the patient.    Past Medical History  Diagnosis Date  . Thyroid disease   . Melanoma in situ of scalp   . Abdominal swelling   . Pelvic pain in female     occasional  . Fatigue   . Weight gain   . Constipation   . Skin disease   . Dizziness   . Vaginal discharge   . Sinus infection   . Generalized headaches   . Change in vision   . Palpitations   . Breast pain   . Weakness     musculoskeletal  . Musculoskeletal pain   . Skin lesion of breast     left - 6 mm lightly pigmented, upper/inner quadrant  . History of chlamydia   . Cancer ([place])     skin  . Neuromuscular disorder ([place])   . Hyperlipidemia    Past Surgical History  Procedure Laterality Date  . Cesarean section  2002, 2005  . Abdominal hysterectomy  2010 - approximate    partial  . Hernia repair  1992 or 1993   Family History  Problem Relation Age of Onset  . Diabetes Mother   . Heart disease Father   . Cancer Maternal Grandmother     breast  . Cancer Paternal Grandmother     breast, uterine   Social History  Substance Use Topics  . Smoking status: Never Smoker   . Smokeless tobacco: Never Used  . Alcohol Use: Yes     Comment: 4 - 5 drinks during week   OB History    No data available     Review of Systems + sore throat + cough No pleuritic pain No wheezing + nasal congestion + post-nasal drainage No sinus pain/pressure No itchy/red eyes ? earache No hemoptysis No SOB No fever, + chills/sweats No nausea No vomiting No abdominal pain No  diarrhea No urinary symptoms No skin rash + fatigue + myalgias + headache Used OTC meds without relief  Allergies  Amoxicillin; Avelox; Codeine; and Sulfa antibiotics  Home Medications   Prior to Admission medications   Medication Sig Start Date End Date Taking? Authorizing Provider  benzonatate (TESSALON) 200 MG capsule Take 1 capsule (200 mg total) by mouth at bedtime. Take as needed for cough 08/10/15   Kandra Nicolas, MD  doxycycline (VIBRAMYCIN) 100 MG capsule Take 1 capsule (100 mg total) by mouth 2 (two) times daily. Take with food (Rx void after 08/18/15) 08/10/15   Kandra Nicolas, MD  levothyroxine (SYNTHROID, LEVOTHROID) 25 MCG tablet Take 25 mcg by mouth daily.    Historical Provider, MD   Meds Ordered and Administered this Visit  Medications - No data to display  BP 135/91 mmHg  Pulse 105  Temp(Src) 100.8 F (38.2 C) (Oral)  Ht 5' 5.5" (1.664 m)  Wt 167 lb (75.751 kg)  BMI 27.36 kg/m2  SpO2 94% No data found.   Physical Exam Nursing notes and Vital Signs reviewed. Appearance:  Patient appears stated  age, and in no acute distress Eyes:  Pupils are equal, round, and reactive to light and accomodation.  Extraocular movement is intact.  Conjunctivae are not inflamed  Ears:  Canals normal.  Tympanic membranes normal.  Nose:  Mildly congested turbinates.  No sinus tenderness.   Pharynx:  Normal Neck:  Supple.  Tender enlarged posterior/lateral nodes are palpated bilaterally  Lungs:  Clear to auscultation.  Breath sounds are equal.  Moving air well. Heart:  Regular rate and rhythm without murmurs, rubs, or gallops.  Abdomen:  Nontender without masses or hepatosplenomegaly.  Bowel sounds are present.  No CVA or flank tenderness.  Extremities:  No edema.  Skin:  No rash present.   ED Course  Procedures none    Labs Reviewed  POCT INFLUENZA A/B negative      MDM   1. Viral URI with cough    There is no evidence of bacterial infection today.  Treat  symptomatically for now  Prescription written for Benzonatate (Tessalon) to take at bedtime for night-time cough.  Take plain guaifenesin (1200mg  extended release tabs such as Mucinex) twice daily, with plenty of water, for cough and congestion.  May add Pseudoephedrine (30mg , one or two every 4 to 6 hours) for sinus congestion.  Get adequate rest.   Also recommend using saline nasal spray several times daily and saline nasal irrigation (AYR is a common brand).    Try warm salt water gargles for sore throat.  Stop all antihistamines for now, and other non-prescription cough/cold preparations. May take Ibuprofen 200mg , 4 tabs every 8 hours with food for body aches, headache, etc. Begin doxycycline if not improving about one week or if persistent fever develops (Given a prescription to hold, with an expiration date)  Follow-up with family doctor if not improving about10 days.     Kandra Nicolas, MD 08/16/15 [phone]

## 2015-08-10 NOTE — ED Notes (Signed)
Pt c/o body aches last night, bilateral ear pain, HA

## 2015-08-10 NOTE — Discharge Instructions (Signed)
Take plain guaifenesin (1200mg  extended release tabs such as Mucinex) twice daily, with plenty of water, for cough and congestion.  May add Pseudoephedrine (30mg , one or two every 4 to 6 hours) for sinus congestion.  Get adequate rest.   Also recommend using saline nasal spray several times daily and saline nasal irrigation (AYR is a common brand).    Try warm salt water gargles for sore throat.  Stop all antihistamines for now, and other non-prescription cough/cold preparations. May take Ibuprofen 200mg , 4 tabs every 8 hours with food for body aches, headache, etc. Begin doxycycline if not improving about one week or if persistent fever develops  Follow-up with family doctor if not improving about10 days.

## 2021-08-14 ENCOUNTER — Ambulatory Visit
Admission: EM | Admit: 2021-08-14 | Discharge: 2021-08-14 | Disposition: A | Discharge status: Home / Self Care | Payer: BC Managed Care – PPO | Attending: Family Medicine | Admitting: Family Medicine

## 2021-08-14 DIAGNOSIS — N3001 Acute cystitis with hematuria: Secondary | ICD-10-CM | POA: Diagnosis present

## 2021-08-14 DIAGNOSIS — R350 Frequency of micturition: Secondary | ICD-10-CM | POA: Diagnosis present

## 2021-08-14 LAB — POCT URINALYSIS DIP (MANUAL ENTRY)
Bilirubin, UA: NEGATIVE
Glucose, UA: NEGATIVE mg/dL
Ketones, POC UA: NEGATIVE mg/dL
Nitrite, UA: NEGATIVE
Protein Ur, POC: 30 mg/dL — AB
Spec Grav, UA: 1.015 (ref 1.010–1.025)
Urobilinogen, UA: 0.2 E.U./dL
pH, UA: 5.5 (ref 5.0–8.0)

## 2021-08-14 MED ORDER — NITROFURANTOIN MONOHYD MACRO 100 MG PO CAPS: 100.0000 mg | ORAL_CAPSULE | Freq: Two times a day (BID) | ORAL | 0 refills | Status: AC

## 2021-08-14 NOTE — Discharge Instructions (Addendum)
Instructed patient to take medication as directed with food to completion.  Encouraged patient to increase daily water intake while taking this medication.  Advised patient we will follow-up with urine culture results once received.

## 2021-08-14 NOTE — ED Provider Notes (Signed)
Margaret Andrews [facility]    CSN: [id number] Arrival date & time: 08/14/21  0840      History   Chief Complaint Chief Complaint  Patient presents with   Urinary Frequency   Back Pain   Chills    HPI Margaret Andrews is a 56 y.o. female.   HPI 56 year old female presents with chills, sweating, lower back pain and urinary frequency 3 weeks.  PMH significant for neuromuscular disorder, melanoma in situ of scalp, and fatigue.  Past Medical History:  Diagnosis Date   Abdominal swelling    Breast pain    Cancer ([facility])    skin   Change in vision    Constipation    Dizziness    Fatigue    Generalized headaches    History of chlamydia    Hyperlipidemia    Melanoma in situ of scalp ([facility])    Musculoskeletal pain    Neuromuscular disorder ([facility])    Palpitations    Pelvic pain in female    occasional   Sinus infection    Skin disease    Skin lesion of breast    left - 6 mm lightly pigmented, upper/inner quadrant   Thyroid disease    Vaginal discharge    Weakness    musculoskeletal   Weight gain     There are no problems to display for this patient.   Past Surgical History:  Procedure Laterality Date   ABDOMINAL HYSTERECTOMY  2010 - approximate   partial   CESAREAN SECTION  2002, 2005   [place] or 1993    OB History   No obstetric history on file.      Home Medications    Prior to Admission medications   Medication Sig Start Date End Date Taking? Authorizing Provider  nitrofurantoin, macrocrystal-monohydrate, (MACROBID) 100 MG capsule Take 1 capsule (100 mg total) by mouth 2 (two) times daily for 7 days. 08/14/21 08/21/21 Yes Eliezer Lofts, FNP    Family History Family History  Problem Relation Age of Onset   Diabetes Mother    Heart disease Father    Cancer Maternal Grandmother        breast   Cancer Paternal Grandmother        breast, uterine    Social History Social History   Tobacco Use   Smoking status: Never   Smokeless tobacco:  Never  Substance Use Topics   Alcohol use: Yes    Comment: 4 - 5 drinks during week   Drug use: No     Allergies   Amoxicillin, Avelox [moxifloxacin hcl in nacl], Codeine, and Sulfa antibiotics   Review of Systems Review of Systems  Constitutional:  Positive for chills.  Genitourinary:  Positive for frequency and urgency.  All other systems reviewed and are negative.    Physical Exam Triage Vital Signs ED Triage Vitals  Enc Vitals Group     BP      Pulse      Resp      Temp      Temp src      SpO2      Weight      Height      Head Circumference      Peak Flow      Pain Score      Pain Loc      Pain Edu?      Excl. in [place]?    No data found.  Updated Vital Signs BP (!) 145/91 (BP  Location: Right Arm)   Pulse (!) 101   Temp 98.6 F (37 C) (Oral)   Resp 17   SpO2 100%     Physical Exam Vitals and nursing note reviewed.  Constitutional:      General: She is not in acute distress.    Appearance: Normal appearance. She is obese. She is not ill-appearing.  HENT:     Head: Normocephalic and atraumatic.     Mouth/Throat:     Mouth: Mucous membranes are moist.     Pharynx: Oropharynx is clear.  Eyes:     Extraocular Movements: Extraocular movements intact.     Conjunctiva/sclera: Conjunctivae normal.     Pupils: Pupils are equal, round, and reactive to light.  Cardiovascular:     Rate and Rhythm: Normal rate and regular rhythm.     Pulses: Normal pulses.     Heart sounds: Normal heart sounds.  Pulmonary:     Effort: Pulmonary effort is normal.     Breath sounds: Normal breath sounds. No wheezing or rhonchi.  Abdominal:     Tenderness: There is no right CVA tenderness or left CVA tenderness.  Musculoskeletal:        General: Normal range of motion.     Cervical back: Normal range of motion and neck supple.  Skin:    General: Skin is warm and dry.  Neurological:     General: No focal deficit present.     Mental Status: She is alert and oriented to  person, place, and time. Mental status is at baseline.      UC Treatments / Results  Labs (all labs ordered are listed, but only abnormal results are displayed) Labs Reviewed  POCT URINALYSIS DIP (MANUAL ENTRY) - Abnormal; Notable for the following components:      Result Value   Clarity, UA cloudy (*)    Blood, UA trace-intact (*)    Protein Ur, POC =30 (*)    Leukocytes, UA Moderate (2+) (*)    All other components within normal limits  URINE CULTURE    EKG   Radiology No results found.  Procedures Procedures (including critical care time)  Medications Ordered in UC Medications - No data to display  Initial Impression / Assessment and Plan / UC Course  I have reviewed the triage vital signs and the nursing notes.  Pertinent labs & imaging results that were available during my care of the patient were reviewed by me and considered in my medical decision making (see chart for details).     MDM: 1. Acute cystitis with hematuria-Rx'd Macrobid. Instructed patient to take medication as directed with food to completion.  Encouraged patient to increase daily water intake while taking this medication.  Advised patient we will follow-up with urine culture results once received.  Patient discharged home, hemodynamically stable.  Final Clinical Impressions(s) / UC Diagnoses   Final diagnoses:  Urinary frequency  Acute cystitis with hematuria     Discharge Instructions      Instructed patient to take medication as directed with food to completion.  Encouraged patient to increase daily water intake while taking this medication.  Advised patient we will follow-up with urine culture results once received.     ED Prescriptions     Medication Sig Dispense Auth. Provider   nitrofurantoin, macrocrystal-monohydrate, (MACROBID) 100 MG capsule Take 1 capsule (100 mg total) by mouth 2 (two) times daily for 7 days. 14 capsule Eliezer Lofts, FNP      PDMP not reviewed  this  encounter.   Eliezer Lofts, [place] 08/14/21 [phone]

## 2021-08-14 NOTE — ED Triage Notes (Signed)
Pt c/o urinary frequency, back pain and chills x 3 weeks. Advil prn.

## 2021-08-15 ENCOUNTER — Telehealth: Payer: Self-pay

## 2021-08-15 NOTE — Telephone Encounter (Signed)
Pt left a message stating that she was seen for a UTI and couldn't get her fever to go down.   Pt states that she didn't have a fever while in the clinic but was told if she did spike one to give the office a call.  Pt states that she was taking Advil to bring the fever down.  Pt states that her fever was 101.  Pt states that she finally got her fever to break around 2:00 am on 7/15.  Pt was instructed to give our office a call if this happens again.

## 2021-08-16 ENCOUNTER — Emergency Department (HOSPITAL_BASED_OUTPATIENT_CLINIC_OR_DEPARTMENT_OTHER): Payer: BC Managed Care – PPO

## 2021-08-16 ENCOUNTER — Emergency Department (HOSPITAL_BASED_OUTPATIENT_CLINIC_OR_DEPARTMENT_OTHER)
Admission: EM | Admit: 2021-08-16 | Discharge: 2021-08-16 | Disposition: A | Discharge status: Home / Self Care | Payer: BC Managed Care – PPO | Attending: Emergency Medicine | Admitting: Emergency Medicine

## 2021-08-16 ENCOUNTER — Encounter (HOSPITAL_BASED_OUTPATIENT_CLINIC_OR_DEPARTMENT_OTHER): Payer: Self-pay | Admitting: Emergency Medicine

## 2021-08-16 ENCOUNTER — Ambulatory Visit
Admission: EM | Admit: 2021-08-16 | Discharge: 2021-08-16 | Disposition: A | Discharge status: Home / Self Care | Payer: BC Managed Care – PPO

## 2021-08-16 ENCOUNTER — Other Ambulatory Visit: Payer: Self-pay

## 2021-08-16 DIAGNOSIS — N3001 Acute cystitis with hematuria: Secondary | ICD-10-CM

## 2021-08-16 DIAGNOSIS — R7309 Other abnormal glucose: Secondary | ICD-10-CM | POA: Insufficient documentation

## 2021-08-16 DIAGNOSIS — R109 Unspecified abdominal pain: Secondary | ICD-10-CM | POA: Diagnosis present

## 2021-08-16 DIAGNOSIS — N12 Tubulo-interstitial nephritis, not specified as acute or chronic: Secondary | ICD-10-CM | POA: Insufficient documentation

## 2021-08-16 DIAGNOSIS — R1030 Lower abdominal pain, unspecified: Secondary | ICD-10-CM | POA: Diagnosis not present

## 2021-08-16 LAB — PREGNANCY, URINE: Preg Test, Ur: NEGATIVE

## 2021-08-16 LAB — URINALYSIS, ROUTINE W REFLEX MICROSCOPIC
Bilirubin Urine: NEGATIVE
Glucose, UA: NEGATIVE mg/dL
Ketones, ur: 40 mg/dL — AB
Nitrite: NEGATIVE
Protein, ur: NEGATIVE mg/dL
Specific Gravity, Urine: 1.015 (ref 1.005–1.030)
pH: 5.5 (ref 5.0–8.0)

## 2021-08-16 LAB — LIPASE, BLOOD: Lipase: 33 U/L (ref 11–51)

## 2021-08-16 LAB — COMPREHENSIVE METABOLIC PANEL
ALT: 25 U/L (ref 0–44)
AST: 32 U/L (ref 15–41)
Albumin: 3.8 g/dL (ref 3.5–5.0)
Alkaline Phosphatase: 65 U/L (ref 38–126)
Anion gap: 9 (ref 5–15)
BUN: 9 mg/dL (ref 6–20)
CO2: 24 mmol/L (ref 22–32)
Calcium: 9.2 mg/dL (ref 8.9–10.3)
Chloride: 102 mmol/L (ref 98–111)
Creatinine, Ser: 0.65 mg/dL (ref 0.44–1.00)
GFR, Estimated: >60 mL/min (ref >60)
Glucose, Bld: 109 mg/dL — ABNORMAL HIGH (ref 70–99)
Potassium: 3.8 mmol/L (ref 3.5–5.1)
Sodium: 135 mmol/L (ref 135–145)
Total Bilirubin: 0.6 mg/dL (ref 0.3–1.2)
Total Protein: 7.6 g/dL (ref 6.5–8.1)

## 2021-08-16 LAB — CBC
HCT: 39.7 % (ref 36.0–46.0)
Hemoglobin: 13.6 g/dL (ref 12.0–15.0)
MCH: 30.8 pg (ref 26.0–34.0)
MCHC: 34.3 g/dL (ref 30.0–36.0)
MCV: 89.8 fL (ref 80.0–100.0)
Platelets: 208 10*3/uL (ref 150–400)
RBC: 4.42 MIL/uL (ref 3.87–5.11)
RDW: 12.5 % (ref 11.5–15.5)
WBC: 6.1 10*3/uL (ref 4.0–10.5)
nRBC: 0 % (ref 0.0–0.2)

## 2021-08-16 LAB — URINALYSIS, MICROSCOPIC (REFLEX): WBC, UA: >50 WBC/hpf (ref 0–5)

## 2021-08-16 LAB — URINE CULTURE: Culture: >=100000 — AB

## 2021-08-16 MED ORDER — SODIUM CHLORIDE 0.9 % IV BOLUS
1000.0000 mL | Freq: Once | INTRAVENOUS | Status: AC
  Administered 2021-08-16: 1000 mL via INTRAVENOUS

## 2021-08-16 MED ORDER — IOHEXOL 300 MG/ML  SOLN
80.0000 mL | Freq: Once | INTRAMUSCULAR | Status: AC | PRN
  Administered 2021-08-16: 80 mL via INTRAVENOUS

## 2021-08-16 MED ORDER — SODIUM CHLORIDE 0.9 % IV SOLN: INTRAVENOUS | Status: DC | PRN

## 2021-08-16 MED ORDER — SODIUM CHLORIDE 0.9 % IV SOLN
1.0000 g | INTRAVENOUS | Status: DC
  Administered 2021-08-16: 1 g via INTRAVENOUS
  Filled 2021-08-16: qty 10

## 2021-08-16 MED ORDER — CEFADROXIL 500 MG PO CAPS: 500.0000 mg | ORAL_CAPSULE | Freq: Two times a day (BID) | ORAL | 0 refills | Status: DC

## 2021-08-16 NOTE — ED Triage Notes (Signed)
Pt arrives pov, steady gait, referral by UC for CT. Pt c/o fever, HA, generalized body aches, chills, bilateral lower back pain x 3 weeks, and epigastric pain and nausea x 3 days.

## 2021-08-16 NOTE — ED Provider Notes (Signed)
[name]    CSN: [id number] Arrival date & time: 08/16/21  0804      History   Chief Complaint Chief Complaint  Patient presents with   Fever   Headache   Abdominal Pain    HPI Margaret Andrews is a 56 y.o. female.   HPI patient presents with fever, body aches, headaches, chills, lower back pain, and abdominal pain.  Reports the symptoms began 3 weeks ago.  Patient was evaluated by me 2 days ago on Friday, 08/14/2021 for acute cystitis with hematuria was prescribed Macrobid.  Patient's urinary culture revealed 100,000 colony count of E. coli.  PMH significant for generalized headaches, abdominal swelling, and neuromuscular disorder.  Past Medical History:  Diagnosis Date   Abdominal swelling    Breast pain    Cancer (HCC)    skin   Change in vision    Constipation    Dizziness    Fatigue    Fatty liver    Generalized headaches    History of chlamydia    Hyperlipidemia    Melanoma in situ of scalp (HCC)    Musculoskeletal pain    Neuromuscular disorder (HCC)    Palpitations    Pelvic pain in female    occasional   Sinus infection    Skin disease    Skin lesion of breast    left - 6 mm lightly pigmented, upper/inner quadrant   Thyroid disease    Vaginal discharge    Weakness    musculoskeletal   Weight gain     There are no problems to display for this patient.   Past Surgical History:  Procedure Laterality Date   ABDOMINAL HYSTERECTOMY  2010 - approximate   partial   CESAREAN SECTION  2002, 2005   [place] or 1993    OB History   No obstetric history on file.      Home Medications    Prior to Admission medications   Medication Sig Start Date End Date Taking? Authorizing Provider  ibuprofen (ADVIL) 400 MG tablet Take 400 mg by mouth every 6 (six) hours as needed.   Yes [provider]  nitrofurantoin, macrocrystal-monohydrate, (MACROBID) 100 MG capsule Take 1 capsule (100 mg total) by mouth 2 (two) times daily for  7 days. 08/14/21 08/21/21  Eliezer Lofts, FNP    Family History Family History  Problem Relation Age of Onset   Diabetes Mother    Heart disease Father    Cancer Maternal Grandmother        breast   Cancer Paternal Grandmother        breast, uterine    Social History Social History   Tobacco Use   Smoking status: Never   Smokeless tobacco: Never  Vaping Use   Vaping Use: Never used  Substance Use Topics   Alcohol use: Yes    Alcohol/week: 7.0 standard drinks of alcohol    Types: 7 Standard drinks or equivalent per week    Comment: weekly   Drug use: No     Allergies   Amoxicillin, Avelox [moxifloxacin hcl in nacl], Codeine, and Sulfa antibiotics   Review of Systems Review of Systems  Constitutional:  Positive for fever.  Gastrointestinal:  Positive for abdominal pain.  Musculoskeletal:  Positive for back pain and myalgias.  All other systems reviewed and are negative.    Physical Exam Triage Vital Signs ED Triage Vitals  Enc Vitals Group     BP 08/16/21 0825 (!) 147/104  Pulse Rate 08/16/21 0825 92     Resp 08/16/21 0825 20     Temp 08/16/21 0825 98.2 F (36.8 C)     Temp Source 08/16/21 0825 Oral     SpO2 08/16/21 0825 100 %     Weight 08/16/21 0826 126 lb (57.2 kg)     Height 08/16/21 0826 5' 5.5" (1.664 m)     Head Circumference --      Peak Flow --      Pain Score 08/16/21 0824 3     Pain Loc --      Pain Edu? --      Excl. in [place]? --    No data found.  Updated Vital Signs BP 138/90 (BP Location: Right Arm)   Pulse 92   Temp 98.2 F (36.8 C) (Oral)   Resp 20   Ht 5' 5.5" (1.664 m)   Wt 126 lb (57.2 kg)   SpO2 100%   BMI 20.65 kg/m     Physical Exam Vitals and nursing note reviewed.  Constitutional:      General: She is not in acute distress.    Appearance: She is well-developed and normal weight. She is not ill-appearing.  HENT:     Head: Normocephalic and atraumatic.     Mouth/Throat:     Mouth: Mucous membranes are moist.      Pharynx: Oropharynx is clear.  Eyes:     Extraocular Movements: Extraocular movements intact.     Pupils: Pupils are equal, round, and reactive to light.  Cardiovascular:     Rate and Rhythm: Normal rate and regular rhythm.     Heart sounds: Normal heart sounds. No murmur heard. Pulmonary:     Effort: Pulmonary effort is normal.     Breath sounds: Normal breath sounds. No wheezing, rhonchi or rales.  Abdominal:     General: Abdomen is flat. Bowel sounds are absent. There is no distension.     Palpations: Abdomen is rigid. There is no shifting dullness, fluid wave, hepatomegaly, splenomegaly, mass or pulsatile mass.     Tenderness: There is abdominal tenderness in the right lower quadrant, suprapubic area and left lower quadrant. There is left CVA tenderness. There is no right CVA tenderness, guarding or rebound. Negative signs include Murphy's sign, Rovsing's sign and McBurney's sign.  Skin:    General: Skin is warm and dry.  Neurological:     General: No focal deficit present.     Mental Status: She is alert and oriented to person, place, and time.      UC Treatments / Results  Labs (all labs ordered are listed, but only abnormal results are displayed) Labs Reviewed - No data to display  EKG   Radiology No results found.  Procedures Procedures (including critical care time)  Medications Ordered in UC Medications - No data to display  Initial Impression / Assessment and Plan / UC Course  I have reviewed the triage vital signs and the nursing notes.  Pertinent labs & imaging results that were available during my care of the patient were reviewed by me and considered in my medical decision making (see chart for details).     MDM: 1.  Lower abdominal pain-patient has been made aware of most recent urine culture results and advised to complete Macrobid as previously prescribed. Instructed patient to go to [facility] ED NOW for further evaluation  of current symptoms.  Advised this will probably involve CT of abdomen and  pelvis with contrast (to rule out kidney stone or other), IV fluids, IV pain medication, and serological testing.  Patient agreed and verbalized understanding of these instructions and this plan of care this morning.  Patient discharged, hemodynamically stable. Final Clinical Impressions(s) / UC Diagnoses   Final diagnoses:  Lower abdominal pain     Discharge Instructions      Instructed patient to go to [facility] ED NOW for further evaluation of current symptoms.  Advised this will probably involve CT of abdomen and pelvis with contrast (to rule out kidney stone or other), IV fluids, IV pain medication, and serological testing.     ED Prescriptions   None    PDMP not reviewed this encounter.   Eliezer Lofts, [place] 08/16/21 [phone]

## 2021-08-16 NOTE — ED Notes (Signed)
ED Provider at bedside. 

## 2021-08-16 NOTE — ED Notes (Signed)
Patient is being discharged from the Urgent Care and sent to the Emergency Department via pov . Per Kathi Ludwig, FNP, patient is in need of higher level of care due to need for imaging and labs. Patient is aware and verbalizes understanding of plan of care.  Vitals:   08/16/21 0825 08/16/21 0830  BP: (!) 147/104 138/90  Pulse: 92   Resp: 20   Temp: 98.2 F (36.8 C)   SpO2: 100%

## 2021-08-16 NOTE — ED Notes (Signed)
Discharge instructions reviewed with patient. Patient verbalizes understanding, no further questions at this time. Medications/prescriptions and follow up information provided. No acute distress noted at time of departure.  

## 2021-08-16 NOTE — Discharge Instructions (Addendum)
Instructed patient to go to [facility] ED NOW for further evaluation of current symptoms.  Advised this will probably involve CT of abdomen and pelvis with contrast (to rule out kidney stone or other), IV fluids, IV pain medication, and serological testing.

## 2021-08-16 NOTE — ED Triage Notes (Signed)
Pt presents to Urgent Care with c/o fever, body aches (particularly headaches), chills, lower back pain, and abdominal pain. States some of these symptoms began approx 3 weeks ago and she was seen here at [facility] two days ago, dx w/ UTI.

## 2021-08-16 NOTE — ED Provider Notes (Signed)
[place] EMERGENCY DEPARTMENT Provider Note   CSN: [id number] Arrival date & time: 08/16/21  4008     History Chief Complaint  Patient presents with   Abdominal Pain    Margaret Andrews is a 56 y.o. female patient who presents to the emergency department today for further evaluation of diffuse abdominal pain, intermittent fevers, chills.  This been ongoing for the last week.  Patient was seen evaluated urgent care twice with the symptoms.  During her first visit, She had a urinalysis which showed evidence of urinary tract infection.  Urine culture grew out E. coli.  She was placed on nitrofurantoin.  She has been taking this for 3 days.  Fevers started again last night up to 101.  She was seen evaluated again at urgent care today and was sent to the emergency room for further evaluation.   Abdominal Pain      Home Medications Prior to Admission medications   Medication Sig Start Date End Date Taking? Authorizing Provider  cefadroxil (DURICEF) 500 MG capsule Take 1 capsule (500 mg total) by mouth 2 (two) times daily. 08/16/21  Yes Raul Del, Obi Scrima M, PA-C  ibuprofen (ADVIL) 400 MG tablet Take 400 mg by mouth every 6 (six) hours as needed.    [provider]  nitrofurantoin, macrocrystal-monohydrate, (MACROBID) 100 MG capsule Take 1 capsule (100 mg total) by mouth 2 (two) times daily for 7 days. 08/14/21 08/21/21  Eliezer Lofts, FNP      Allergies    Amoxicillin, Avelox [moxifloxacin hcl in nacl], Codeine, and Sulfa antibiotics    Review of Systems   Review of Systems  Gastrointestinal:  Positive for abdominal pain.  All other systems reviewed and are negative.   Physical Exam Updated Vital Signs BP (!) 135/117   Pulse 94   Temp 99.5 F (37.5 C) (Oral)   Resp 20   Ht 5' 5.5" (1.664 m)   Wt 57.2 kg   SpO2 100%   BMI 20.65 kg/m  Physical Exam Vitals and nursing note reviewed.  Constitutional:      General: She is not in acute distress.    Appearance:  Normal appearance.  HENT:     Head: Normocephalic and atraumatic.  Eyes:     General:        Right eye: No discharge.        Left eye: No discharge.  Cardiovascular:     Comments: Regular rate and rhythm.  S1/S2 are distinct without any evidence of murmur, rubs, or gallops.  Radial pulses are 2+ bilaterally.  Dorsalis pedis pulses are 2+ bilaterally.  No evidence of pedal edema. Pulmonary:     Comments: Clear to auscultation bilaterally.  Normal effort.  No respiratory distress.  No evidence of wheezes, rales, or rhonchi heard throughout. Abdominal:     General: Abdomen is flat. Bowel sounds are normal. There is no distension.     Tenderness: There is abdominal tenderness in the right upper quadrant and right lower quadrant. There is no guarding or rebound.  Musculoskeletal:        General: Normal range of motion.     Cervical back: Neck supple.  Skin:    General: Skin is warm and dry.     Findings: No rash.  Neurological:     General: No focal deficit present.     Mental Status: She is alert.  Psychiatric:        Mood and Affect: Mood normal.  Behavior: Behavior normal.     ED Results / Procedures / Treatments   Labs (all labs ordered are listed, but only abnormal results are displayed) Labs Reviewed  COMPREHENSIVE METABOLIC PANEL - Abnormal; Notable for the following components:      Result Value   Glucose, Bld 109 (*)    All other components within normal limits  URINALYSIS, ROUTINE W REFLEX MICROSCOPIC - Abnormal; Notable for the following components:   APPearance CLOUDY (*)    Hgb urine dipstick TRACE (*)    Ketones, ur 40 (*)    Leukocytes,Ua LARGE (*)    All other components within normal limits  URINALYSIS, MICROSCOPIC (REFLEX) - Abnormal; Notable for the following components:   Bacteria, UA MANY (*)    Non Squamous Epithelial PRESENT (*)    All other components within normal limits  LIPASE, BLOOD  CBC  PREGNANCY, URINE    EKG EKG  Interpretation  Date/Time:  Sunday August 16 2021 09:54:41 EDT Ventricular Rate:  101 PR Interval:  124 QRS Duration: 72 QT Interval:  342 QTC Calculation: 443 R Axis:   91 Text Interpretation: Sinus tachycardia Rightward axis Borderline ECG No previous ECGs available Confirmed by Aletta Edouard [phone]) on 08/16/2021 10:07:06 AM  Radiology CT ABDOMEN PELVIS W CONTRAST  Result Date: 08/16/2021 CLINICAL DATA:  56 year old female with acute abdomina and pelvic pain. EXAM: CT ABDOMEN AND PELVIS WITH CONTRAST TECHNIQUE: Multidetector CT imaging of the abdomen and pelvis was performed using the standard protocol following bolus administration of intravenous contrast. RADIATION DOSE REDUCTION: This exam was performed according to the departmental dose-optimization program which includes automated exposure control, adjustment of the mA and/or kV according to patient size and/or use of iterative reconstruction technique. CONTRAST:  8m OMNIPAQUE IOHEXOL 300 MG/ML  SOLN COMPARISON:  None Available. FINDINGS: Lower chest: Mild bibasilar atelectasis/scarring noted. Hepatobiliary: The liver and gallbladder are unremarkable. There is no evidence of intrahepatic or extrahepatic biliary dilatation. Pancreas: Unremarkable Spleen: Unremarkable Adrenals/Urinary Tract: Wedge-shaped hypodensities in the RIGHT kidney noted with mild adjacent inflammation suspicious for infection/pyelonephritis. No discrete abscess is noted. There is also mild wall enhancement of the RIGHT renal pelvis and RIGHT ureter likely representing infection. There is no evidence of hydronephrosis or urinary calculi. The adrenal glands and bladder are unremarkable. Stomach/Bowel: Stomach is within normal limits. No evidence of bowel wall thickening, distention, or inflammatory changes. Vascular/Lymphatic: No significant vascular findings are present. No enlarged abdominal or pelvic lymph nodes. Reproductive: Prostate is unremarkable. Other: A trace  amount of free fluid within the pelvis is noted. No evidence of pneumoperitoneum or focal collection. Musculoskeletal: No acute or suspicious bony abnormalities are noted. IMPRESSION: 1. Wedge-shaped hypodensities in the RIGHT kidney and RIGHT renal pelvis/ureteral wall enhancement with mild adjacent inflammation likely representing pyelonephritis and UTI. No evidence of hydronephrosis or abscess. 2. Trace amount of free fluid within the pelvis, nonspecific. Electronically Signed   By: JMargarette CanadaM.D.   On: 08/16/2021 11:23    Procedures Procedures    Medications Ordered in ED Medications  cefTRIAXone (ROCEPHIN) 1 g in sodium chloride 0.9 % 100 mL IVPB (0 g Intravenous Stopped 08/16/21 1222)  0.9 %  sodium chloride infusion ( Intravenous New Bag/Given 08/16/21 1150)  sodium chloride 0.9 % bolus 1,000 mL ( Intravenous Stopped 08/16/21 1148)  iohexol (OMNIPAQUE) 300 MG/ML solution 80 mL (80 mLs Intravenous Contrast Given 08/16/21 1042)    ED Course/ Medical Decision Making/ A&P Clinical Course as of 08/16/21 1330  Sun Aug 16, 2021  1142 I discussed the amoxicillin allergy with the patient at the bedside.  She says that she had amoxicillin with codeine in her 76s and she broke out in hives and has not had any since. [CF]  1158 CBC Normal. [CF]  1158 Comprehensive metabolic panel(!) Elevated glucose but otherwise no significant abnormalities. [CF]  1158 Lipase, blood Lipase negative. [CF]  1158 Pregnancy, urine Negative. [CF]  1158 Urinalysis, Routine w reflex microscopic(!) There is still it is evidence of urinary tract infection with a large amount of pyuria. [CF]  12 CT ABDOMEN PELVIS W CONTRAST I personally ordered and interpreted a CT abdomen pelvis with contrast which shows evidence of pyelonephritis in the right kidney.  This is consistent with the patient's pain.  Likely secondary to undertreated urinary tract infection.  I will give her 1 g of ceftriaxone here and plan to reassess.  [CF]  1232 I discussed this case with my attending physician who cosigned this note including patient's presenting symptoms, physical exam, and planned diagnostics and interventions. Attending physician stated agreement with plan or made changes to plan which were implemented.      [CF]  3818 On reevaluation, patient states she is feeling better.  She had fluids in addition to ceftriaxone without any adverse reactions.  I will prescribe her Duricef to go home with. [CF]    Clinical Course User Index [CF] Hendricks Limes, PA-C                           Medical Decision Making Flynn Lininger is a 56 y.o. female patient who presents to the emergency department today for further evaluation of diffuse abdominal pain and intermittent fevers.  Patient is in no acute distress at this time.  No evidence of fever here today.  No tachycardia.  Respirations normal.  On the whole lot of abdominal tenderness on exam apart from the right upper and right lower quadrant.  Given the time course and the clinical scenario I will evaluate with some repeat abdominal labs, UA, and CT abdomen pelvis with contrast.  I will plan to reassess.  As highlighted in ED course, patient comfortable going home.  I will prescribe her Duricef.  She had no reactions with the ceftriaxone.  I am not convinced that her amoxicillin allergy is a true allergy.  Her pyelonephritis is likely causing her symptoms today.  I will have her follow-up with her primary care doctor or return to the emergency department for any worsening symptoms. She is safe for discharge.   Amount and/or Complexity of Data Reviewed Labs: ordered. Decision-making details documented in ED Course. Radiology: ordered. Decision-making details documented in ED Course.  Risk Prescription drug management.   Final Clinical Impression(s) / ED Diagnoses Final diagnoses:  Pyelonephritis  Acute cystitis with hematuria    Rx / DC Orders ED Discharge Orders           Ordered    cefadroxil (DURICEF) 500 MG capsule  2 times daily        08/16/21 1326              Myna Bright [place], [place] 08/16/21 1331    Hayden Rasmussen, MD 08/16/21 [phone]

## 2021-08-16 NOTE — Discharge Instructions (Signed)
Please follow-up with your primary care doctor or with your new doctor once you have established with them.  Please take antibiotics as prescribed.  Like for you to return to the [facility] apartment for any worsening symptoms that you might have.

## 2021-08-20 ENCOUNTER — Ambulatory Visit
Admission: EM | Admit: 2021-08-20 | Discharge: 2021-08-20 | Disposition: A | Discharge status: Home / Self Care | Payer: BC Managed Care – PPO | Attending: Family Medicine | Admitting: Family Medicine

## 2021-08-20 DIAGNOSIS — L500 Allergic urticaria: Secondary | ICD-10-CM | POA: Diagnosis not present

## 2021-08-20 DIAGNOSIS — R21 Rash and other nonspecific skin eruption: Secondary | ICD-10-CM

## 2021-08-20 MED ORDER — PREDNISONE 10 MG (21) PO TBPK: ORAL_TABLET | Freq: Every day | ORAL | 0 refills | Status: DC

## 2021-08-20 MED ORDER — FEXOFENADINE HCL 180 MG PO TABS: 180.0000 mg | ORAL_TABLET | Freq: Every day | ORAL | 0 refills | Status: DC

## 2021-08-20 NOTE — ED Provider Notes (Signed)
[facility] C[facility]    CSN: [id number] Arrival date & time: 08/20/21  1425      History   Chief Complaint Chief Complaint  Patient presents with   Rash    HPI Margaret Andrews is a 56 y.o. female.   HPI 56 year old female presents with a rash that started on mid back approximately 5 days ago and now spread to abdominal region.  Patient was evaluated here on 08/16/2021 for lower abdominal pain, then pyelonephritis at [place] ED on 08/16/2021 as well.  PMH significant for neuromuscular disorder, melanoma in situ of scalp, and weakness.  Past Medical History:  Diagnosis Date   Abdominal swelling    Breast pain    Cancer (HCC)    skin   Change in vision    Constipation    Dizziness    Fatigue    Fatty liver    Generalized headaches    History of chlamydia    Hyperlipidemia    Melanoma in situ of scalp (HCC)    Musculoskeletal pain    Neuromuscular disorder (HCC)    Palpitations    Pelvic pain in female    occasional   Sinus infection    Skin disease    Skin lesion of breast    left - 6 mm lightly pigmented, upper/inner quadrant   Thyroid disease    Vaginal discharge    Weakness    musculoskeletal   Weight gain     There are no problems to display for this patient.   Past Surgical History:  Procedure Laterality Date   ABDOMINAL HYSTERECTOMY  2010 - approximate   partial   CESAREAN SECTION  2002, 2005   [place] or 1993    OB History   No obstetric history on file.      Home Medications    Prior to Admission medications   Medication Sig Start Date End Date Taking? Authorizing Provider  fexofenadine (ALLEGRA ALLERGY) 180 MG tablet Take 1 tablet (180 mg total) by mouth daily for 15 days. 08/20/21 09/04/21 Yes Eliezer Lofts, FNP  predniSONE (STERAPRED UNI-PAK 21 TAB) 10 MG (21) TBPK tablet Take by mouth daily. Take 6 tabs by mouth daily  for 2 days, then 5 tabs for 2 days, then 4 tabs for 2 days, then 3 tabs for 2 days, 2 tabs for  2 days, then 1 tab by mouth daily for 2 days 08/20/21  Yes Eliezer Lofts, FNP  cefadroxil (DURICEF) 500 MG capsule Take 1 capsule (500 mg total) by mouth 2 (two) times daily. 08/16/21   Myna Bright M, PA-C  ibuprofen (ADVIL) 400 MG tablet Take 400 mg by mouth every 6 (six) hours as needed.    [provider]  nitrofurantoin, macrocrystal-monohydrate, (MACROBID) 100 MG capsule Take 1 capsule (100 mg total) by mouth 2 (two) times daily for 7 days. 08/14/21 08/21/21  Eliezer Lofts, FNP    Family History Family History  Problem Relation Age of Onset   Diabetes Mother    Heart disease Father    Cancer Maternal Grandmother        breast   Cancer Paternal Grandmother        breast, uterine    Social History Social History   Tobacco Use   Smoking status: Never   Smokeless tobacco: Never  Vaping Use   Vaping Use: Never used  Substance Use Topics   Alcohol use: Yes    Alcohol/week: 7.0 standard drinks of alcohol  Types: 7 Standard drinks or equivalent per week    Comment: weekly   Drug use: No     Allergies   Amoxicillin, Avelox [moxifloxacin hcl in nacl], Codeine, and Sulfa antibiotics   Review of Systems Review of Systems  Skin:  Positive for rash.  All other systems reviewed and are negative.    Physical Exam Triage Vital Signs ED Triage Vitals  Enc Vitals Group     BP 08/20/21 1444 128/85     Pulse Rate 08/20/21 1444 85     Resp 08/20/21 1444 20     Temp 08/20/21 1444 99.6 F (37.6 C)     Temp Source 08/20/21 1444 Oral     SpO2 08/20/21 1444 97 %     Weight --      Height --      Head Circumference --      Peak Flow --      Pain Score 08/20/21 1440 5     Pain Loc --      Pain Edu? --      Excl. in [place]? --    No data found.  Updated Vital Signs BP 128/85 (BP Location: Right Arm)   Pulse 85   Temp 99.6 F (37.6 C) (Oral)   Resp 20   SpO2 97%      Physical Exam Vitals and nursing note reviewed.  Constitutional:      Appearance: Normal  appearance. She is normal weight.  HENT:     Head: Normocephalic and atraumatic.     Mouth/Throat:     Mouth: Mucous membranes are moist.     Pharynx: Oropharynx is clear.  Eyes:     Extraocular Movements: Extraocular movements intact.     Conjunctiva/sclera: Conjunctivae normal.     Pupils: Pupils are equal, round, and reactive to light.  Cardiovascular:     Rate and Rhythm: Normal rate and regular rhythm.     Pulses: Normal pulses.     Heart sounds: Normal heart sounds. No murmur heard. Pulmonary:     Effort: Pulmonary effort is normal.     Breath sounds: Normal breath sounds. No wheezing, rhonchi or rales.  Musculoskeletal:     Cervical back: Normal range of motion and neck supple.  Skin:    General: Skin is warm and dry.     Comments: Torso (right-sided), mid abdomen: Pruritic erythematous maculopapular eruption, several with acute urticaria appearance-superficial, widespread, confluent plaques with clear dusky centers noted  Neurological:     General: No focal deficit present.     Mental Status: She is alert and oriented to person, place, and time.      UC Treatments / Results  Labs (all labs ordered are listed, but only abnormal results are displayed) Labs Reviewed - No data to display  EKG   Radiology No results found.  Procedures Procedures (including critical care time)  Medications Ordered in UC Medications - No data to display  Initial Impression / Assessment and Plan / UC Course  I have reviewed the triage vital signs and the nursing notes.  Pertinent labs & imaging results that were available during my care of the patient were reviewed by me and considered in my medical decision making (see chart for details).     MDM: 1.  Rash and nonspecific skin eruption-Rx'd SteraPred- Unipak; 2.  Allergic urticaria-Rx'd Allegra. Instructed patient to take medication as directed with food to completion.  Advised patient to take Allegra with prednisone for the next  5 of 10 days.  Encouraged patient increase daily water intake while taking these medications.  Advised patient if symptoms worsen and/or unresolved please follow-up with PCP or here for further evaluation.  Work note provided to patient prior to discharge.  Patient discharged home, hemodynamically stable.  Final Clinical Impressions(s) / UC Diagnoses   Final diagnoses:  Rash and nonspecific skin eruption  Allergic urticaria     Discharge Instructions      Instructed patient to take medication as directed with food to completion.  Advised patient to take Allegra with prednisone for the next 5 of 10 days.  Encouraged patient increase daily water intake while taking these medications.  Advised patient if symptoms worsen and/or unresolved please follow-up with PCP or here for further evaluation     ED Prescriptions     Medication Sig Dispense Auth. Provider   predniSONE (STERAPRED UNI-PAK 21 TAB) 10 MG (21) TBPK tablet Take by mouth daily. Take 6 tabs by mouth daily  for 2 days, then 5 tabs for 2 days, then 4 tabs for 2 days, then 3 tabs for 2 days, 2 tabs for 2 days, then 1 tab by mouth daily for 2 days 42 tablet Eliezer Lofts, FNP   fexofenadine [facility] ALLERGY) 180 MG tablet Take 1 tablet (180 mg total) by mouth daily for 15 days. 15 tablet Eliezer Lofts, FNP      PDMP not reviewed this encounter.   Eliezer Lofts, [place] 08/20/21 1524

## 2021-08-20 NOTE — ED Triage Notes (Signed)
Pt presents to Urgent Care with c/o rash which started on mid-back approx 5 days ago and today has spread to her mid-abdominal region. Was seen here recently for UTI and also went to the ED after last visit to [facility].

## 2021-08-20 NOTE — Discharge Instructions (Addendum)
Instructed patient to take medication as directed with food to completion.  Advised patient to take Allegra with prednisone for the next 5 of 10 days.  Encouraged patient increase daily water intake while taking these medications.  Advised patient if symptoms worsen and/or unresolved please follow-up with PCP or here for further evaluation

## 2021-09-04 ENCOUNTER — Telehealth: Payer: Self-pay | Admitting: Emergency Medicine

## 2021-09-04 NOTE — Telephone Encounter (Addendum)
Call from [place] regarding return of shingles - finished steroid dose pack - Dr Assunta Found to review and this RN will call pt back.Call back as noted, awaiting review by provider- message left to update pt. Call back to [facility] after chart review by Dr Assunta Found who is requesting pt be re-evaluated to rule out other causes. Message left

## 2021-09-06 ENCOUNTER — Ambulatory Visit
Admission: EM | Admit: 2021-09-06 | Discharge: 2021-09-06 | Disposition: A | Discharge status: Home / Self Care | Payer: BC Managed Care – PPO | Attending: Family Medicine | Admitting: Family Medicine

## 2021-09-06 DIAGNOSIS — R21 Rash and other nonspecific skin eruption: Secondary | ICD-10-CM

## 2021-09-06 MED ORDER — TRIAMCINOLONE ACETONIDE 0.1 % EX CREA: 1.0000 | TOPICAL_CREAM | Freq: Two times a day (BID) | CUTANEOUS | 0 refills | Status: AC

## 2021-09-06 MED ORDER — HYDROXYZINE HCL 25 MG PO TABS: 25.0000 mg | ORAL_TABLET | Freq: Three times a day (TID) | ORAL | 0 refills | Status: AC | PRN

## 2021-09-06 NOTE — Discharge Instructions (Addendum)
  Use the triamcinolone cream on your rash left flank 2 times a day until clear May take hydroxyzine for itching.  This may cause drowsiness.  It is helpful at night See your doctor if not improving in a few days

## 2021-09-06 NOTE — ED Provider Notes (Signed)
Margaret Andrews CARE    CSN: [id number] Arrival date & time: 09/06/21  1450      History   Chief Complaint No chief complaint on file.   HPI Margaret Andrews is a 56 y.o. female.   HPI  Patient had a rash around 1 flank.  She was seen here.  She thought she had shingles.  She was actually diagnosed with urticaria.  She was treated with prednisone and this helped the rash, it went away She is here because now a few days off the prednisone she has another rash.  It looks different from the first rash.  She wonders whether it is "shingles coming back".  Whether it might be something else She has no fever chills or illness Mild itching, but no pain She has not had shingles vaccination She did have chickenpox as a child  Past Medical History:  Diagnosis Date   Abdominal swelling    Breast pain    Cancer ([place])    skin   Change in vision    Constipation    Dizziness    Fatigue    Fatty liver    Generalized headaches    History of chlamydia    Hyperlipidemia    Melanoma in situ of scalp (HCC)    Musculoskeletal pain    Neuromuscular disorder (HCC)    Palpitations    Pelvic pain in female    occasional   Sinus infection    Skin disease    Skin lesion of breast    left - 6 mm lightly pigmented, upper/inner quadrant   Thyroid disease    Vaginal discharge    Weakness    musculoskeletal   Weight gain     There are no problems to display for this patient.   Past Surgical History:  Procedure Laterality Date   ABDOMINAL HYSTERECTOMY  2010 - approximate   partial   CESAREAN SECTION  2002, 2005   [place] or 1993    OB History   No obstetric history on file.      Home Medications    Prior to Admission medications   Medication Sig Start Date End Date Taking? Authorizing Provider  hydrOXYzine (ATARAX) 25 MG tablet Take 1 tablet (25 mg total) by mouth every 8 (eight) hours as needed for itching. 09/06/21  Yes Raylene Everts, MD  triamcinolone cream  (KENALOG) 0.1 % Apply 1 Application topically 2 (two) times daily. 09/06/21  Yes Raylene Everts, MD  ibuprofen (ADVIL) 400 MG tablet Take 400 mg by mouth every 6 (six) hours as needed.    [provider]    Family History Family History  Problem Relation Age of Onset   Diabetes Mother    Heart disease Father    Cancer Maternal Grandmother        breast   Cancer Paternal Grandmother        breast, uterine    Social History Social History   Tobacco Use   Smoking status: Never   Smokeless tobacco: Never  Vaping Use   Vaping Use: Never used  Substance Use Topics   Alcohol use: Yes    Alcohol/week: 7.0 standard drinks of alcohol    Types: 7 Standard drinks or equivalent per week    Comment: weekly   Drug use: No     Allergies   Amoxicillin, Avelox [moxifloxacin hcl in nacl], Codeine, and Sulfa antibiotics   Review of Systems Review of Systems See HPI  Physical  Exam Triage Vital Signs ED Triage Vitals  Enc Vitals Group     BP 09/06/21 1516 125/85     Pulse Rate 09/06/21 1516 97     Resp --      Temp 09/06/21 1516 99.2 F (37.3 C)     Temp Source 09/06/21 1516 Oral     SpO2 09/06/21 1516 97 %     Weight 09/06/21 1519 130 lb (59 kg)     Height 09/06/21 1519 '5\' 5"'$  (1.651 m)     Head Circumference --      Peak Flow --      Pain Score 09/06/21 1518 4     Pain Loc --      Pain Edu? --      Excl. in [place]? --    No data found.  Updated Vital Signs BP 125/85 (BP Location: Right Arm)   Pulse 97   Temp 99.2 F (37.3 C) (Oral)   Ht '5\' 5"'$  (1.651 m)   Wt 59 kg   SpO2 97%   BMI 21.63 kg/m      Physical Exam Constitutional:      General: She is not in acute distress.    Appearance: She is well-developed and normal weight.  HENT:     Head: Normocephalic and atraumatic.  Eyes:     Conjunctiva/sclera: Conjunctivae normal.     Pupils: Pupils are equal, round, and reactive to light.  Cardiovascular:     Rate and Rhythm: Normal rate.  Pulmonary:      Effort: Pulmonary effort is normal. No respiratory distress.  Abdominal:     General: There is no distension.     Palpations: Abdomen is soft.  Musculoskeletal:        General: Normal range of motion.     Cervical back: Normal range of motion.  Skin:    General: Skin is warm and dry.     Findings: Rash present.       Neurological:     Mental Status: She is alert.      UC Treatments / Results  Labs (all labs ordered are listed, but only abnormal results are displayed) Labs Reviewed - No data to display  EKG   Radiology No results found.  Procedures Procedures (including critical care time)  Medications Ordered in UC Medications - No data to display  Initial Impression / Assessment and Plan / UC Course  I have reviewed the triage vital signs and the nursing notes.  Pertinent labs & imaging results that were available during my care of the patient were reviewed by me and considered in my medical decision making (see chart for details).     Honestly the rash on her hip looks like a heat rash.  Mildly pruritic.  We will give her some cortisone cream.  Uncertain whether she ever had shingles.  Follow-up with PCP for shingles shot Final Clinical Impressions(s) / UC Diagnoses   Final diagnoses:  Rash and nonspecific skin eruption     Discharge Instructions       Use the triamcinolone cream on your rash left flank 2 times a day until clear May take hydroxyzine for itching.  This may cause drowsiness.  It is helpful at night See your doctor if not improving in a few days    ED Prescriptions     Medication Sig Dispense Auth. Provider   triamcinolone cream (KENALOG) 0.1 % Apply 1 Application topically 2 (two) times daily. 30 g Raylene Everts, MD  hydrOXYzine (ATARAX) 25 MG tablet Take 1 tablet (25 mg total) by mouth every 8 (eight) hours as needed for itching. 12 tablet Raylene Everts, MD      PDMP not reviewed this encounter.   Raylene Everts,  MD 09/06/21 [id number]

## 2021-09-06 NOTE — ED Triage Notes (Addendum)
Patient c/o  rash on back  possible return of Shingles onset for x2 days

## 2021-09-07 ENCOUNTER — Telehealth: Payer: Self-pay

## 2021-09-07 NOTE — Telephone Encounter (Signed)
Made call to pt to check on status since UC visit. States rash still burns but on way to pick up meds shortly. Advised to call back if any questions or concerns.
